# Patient Record
Sex: Male | Born: 1975 | Race: Black or African American | Hispanic: No | Marital: Single | Smoking: Current every day smoker
Health system: Southern US, Community
[De-identification: ages and names within clinical notes are randomized; demographics above are authoritative.]

## PROBLEM LIST (undated history)

## (undated) HISTORY — PX: NO PAST SURGERIES: SHX2092

---

## 2020-06-10 ENCOUNTER — Emergency Department (HOSPITAL_COMMUNITY): Payer: Self-pay

## 2020-06-10 ENCOUNTER — Encounter (HOSPITAL_COMMUNITY): Payer: Self-pay | Admitting: Emergency Medicine

## 2020-06-10 ENCOUNTER — Emergency Department (HOSPITAL_COMMUNITY)
Admission: EM | Admit: 2020-06-10 | Discharge: 2020-06-10 | Disposition: A | Discharge status: Home / Self Care | Payer: Self-pay | Attending: Emergency Medicine | Admitting: Emergency Medicine

## 2020-06-10 DIAGNOSIS — Y9389 Activity, other specified: Secondary | ICD-10-CM | POA: Insufficient documentation

## 2020-06-10 DIAGNOSIS — S40211A Abrasion of right shoulder, initial encounter: Secondary | ICD-10-CM | POA: Insufficient documentation

## 2020-06-10 DIAGNOSIS — Z20822 Contact with and (suspected) exposure to covid-19: Secondary | ICD-10-CM | POA: Insufficient documentation

## 2020-06-10 DIAGNOSIS — S0990XA Unspecified injury of head, initial encounter: Secondary | ICD-10-CM | POA: Insufficient documentation

## 2020-06-10 DIAGNOSIS — Y92410 Unspecified street and highway as the place of occurrence of the external cause: Secondary | ICD-10-CM | POA: Insufficient documentation

## 2020-06-10 DIAGNOSIS — Y998 Other external cause status: Secondary | ICD-10-CM | POA: Insufficient documentation

## 2020-06-10 DIAGNOSIS — Z23 Encounter for immunization: Secondary | ICD-10-CM | POA: Insufficient documentation

## 2020-06-10 DIAGNOSIS — F17200 Nicotine dependence, unspecified, uncomplicated: Secondary | ICD-10-CM | POA: Insufficient documentation

## 2020-06-10 DIAGNOSIS — T1490XA Injury, unspecified, initial encounter: Secondary | ICD-10-CM

## 2020-06-10 LAB — CBC
HCT: 42.2 % (ref 39.0–52.0)
Hemoglobin: 13.6 g/dL (ref 13.0–17.0)
MCH: 30.8 pg (ref 26.0–34.0)
MCHC: 32.2 g/dL (ref 30.0–36.0)
MCV: 95.7 fL (ref 80.0–100.0)
Platelets: 344 10*3/uL (ref 150–400)
RBC: 4.41 MIL/uL (ref 4.22–5.81)
RDW: 13.1 % (ref 11.5–15.5)
WBC: 7.9 10*3/uL (ref 4.0–10.5)
nRBC: 0 % (ref 0.0–0.2)

## 2020-06-10 LAB — I-STAT CHEM 8, ED
BUN: 4 mg/dL — ABNORMAL LOW (ref 6–20)
Calcium, Ion: 1.06 mmol/L — ABNORMAL LOW (ref 1.15–1.40)
Chloride: 108 mmol/L (ref 98–111)
Creatinine, Ser: 1.3 mg/dL — ABNORMAL HIGH (ref 0.61–1.24)
Glucose, Bld: 96 mg/dL (ref 70–99)
HCT: 41 % (ref 39.0–52.0)
Hemoglobin: 13.9 g/dL (ref 13.0–17.0)
Potassium: 3.7 mmol/L (ref 3.5–5.1)
Sodium: 143 mmol/L (ref 135–145)
TCO2: 20 mmol/L — ABNORMAL LOW (ref 22–32)

## 2020-06-10 LAB — COMPREHENSIVE METABOLIC PANEL
ALT: 25 U/L (ref 0–44)
AST: 51 U/L — ABNORMAL HIGH (ref 15–41)
Albumin: 3.7 g/dL (ref 3.5–5.0)
Alkaline Phosphatase: 49 U/L (ref 38–126)
Anion gap: 12 (ref 5–15)
BUN: 6 mg/dL (ref 6–20)
CO2: 20 mmol/L — ABNORMAL LOW (ref 22–32)
Calcium: 8.8 mg/dL — ABNORMAL LOW (ref 8.9–10.3)
Chloride: 110 mmol/L (ref 98–111)
Creatinine, Ser: 0.91 mg/dL (ref 0.61–1.24)
GFR calc Af Amer: >60 mL/min (ref >60)
GFR calc non Af Amer: >60 mL/min (ref >60)
Glucose, Bld: 92 mg/dL (ref 70–99)
Potassium: 3.9 mmol/L (ref 3.5–5.1)
Sodium: 142 mmol/L (ref 135–145)
Total Bilirubin: 0.6 mg/dL (ref 0.3–1.2)
Total Protein: 6.9 g/dL (ref 6.5–8.1)

## 2020-06-10 LAB — ETHANOL: Alcohol, Ethyl (B): 270 mg/dL — ABNORMAL HIGH (ref <10)

## 2020-06-10 LAB — SAMPLE TO BLOOD BANK

## 2020-06-10 LAB — PROTIME-INR
INR: 1 (ref 0.8–1.2)
Prothrombin Time: 13.2 seconds (ref 11.4–15.2)

## 2020-06-10 LAB — SARS CORONAVIRUS 2 (TAT 6-24 HRS): SARS Coronavirus 2: NEGATIVE

## 2020-06-10 LAB — LACTIC ACID, PLASMA: Lactic Acid, Venous: 1.5 mmol/L (ref 0.5–1.9)

## 2020-06-10 MED ORDER — BACITRACIN ZINC 500 UNIT/GM EX OINT: TOPICAL_OINTMENT | Freq: Two times a day (BID) | CUTANEOUS | Status: DC

## 2020-06-10 MED ORDER — TETANUS-DIPHTH-ACELL PERTUSSIS 5-2.5-18.5 LF-MCG/0.5 IM SUSP
0.5000 mL | Freq: Once | INTRAMUSCULAR | Status: AC
  Administered 2020-06-10: 0.5 mL via INTRAMUSCULAR

## 2020-06-10 MED ORDER — IOHEXOL 300 MG/ML  SOLN
100.0000 mL | Freq: Once | INTRAMUSCULAR | Status: AC | PRN
  Administered 2020-06-10: 100 mL via INTRAVENOUS

## 2020-06-10 MED ORDER — METHOCARBAMOL 500 MG PO TABS: 500.0000 mg | ORAL_TABLET | Freq: Two times a day (BID) | ORAL | 0 refills | Status: DC
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
## 2020-06-10 NOTE — ED Notes (Signed)
Patient transported to CT 
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
## 2020-06-21 ENCOUNTER — Other Ambulatory Visit: Payer: Self-pay

## 2020-06-21 ENCOUNTER — Encounter (HOSPITAL_COMMUNITY): Payer: Self-pay | Admitting: Emergency Medicine

## 2020-06-21 ENCOUNTER — Emergency Department (HOSPITAL_COMMUNITY): Payer: Self-pay

## 2020-06-21 ENCOUNTER — Emergency Department (HOSPITAL_COMMUNITY)
Admission: EM | Admit: 2020-06-21 | Discharge: 2020-06-22 | Disposition: A | Discharge status: Home / Self Care | Payer: Self-pay | Attending: Emergency Medicine | Admitting: Emergency Medicine

## 2020-06-21 DIAGNOSIS — F17200 Nicotine dependence, unspecified, uncomplicated: Secondary | ICD-10-CM | POA: Insufficient documentation

## 2020-06-21 DIAGNOSIS — T1490XA Injury, unspecified, initial encounter: Secondary | ICD-10-CM

## 2020-06-21 DIAGNOSIS — Y9269 Other specified industrial and construction area as the place of occurrence of the external cause: Secondary | ICD-10-CM | POA: Insufficient documentation

## 2020-06-21 DIAGNOSIS — S9032XA Contusion of left foot, initial encounter: Secondary | ICD-10-CM | POA: Insufficient documentation

## 2020-06-21 DIAGNOSIS — F1590 Other stimulant use, unspecified, uncomplicated: Secondary | ICD-10-CM | POA: Insufficient documentation

## 2020-06-21 DIAGNOSIS — W240XXA Contact with lifting devices, not elsewhere classified, initial encounter: Secondary | ICD-10-CM | POA: Insufficient documentation

## 2020-06-21 MED ORDER — HYDROCODONE-ACETAMINOPHEN 5-325 MG PO TABS
2.0000 | ORAL_TABLET | Freq: Once | ORAL | Status: AC
  Administered 2020-06-21: 2 via ORAL
  Filled 2020-06-21: qty 2
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
## 2021-02-05 ENCOUNTER — Observation Stay (HOSPITAL_COMMUNITY)
Admission: EM | Admit: 2021-02-05 | Discharge: 2021-02-07 | Disposition: A | Discharge status: Home / Self Care | Payer: Self-pay | Attending: Orthopedic Surgery | Admitting: Orthopedic Surgery

## 2021-02-05 ENCOUNTER — Encounter (HOSPITAL_COMMUNITY): Payer: Self-pay

## 2021-02-05 ENCOUNTER — Emergency Department (HOSPITAL_COMMUNITY): Payer: Self-pay

## 2021-02-05 DIAGNOSIS — S98112A Complete traumatic amputation of left great toe, initial encounter: Secondary | ICD-10-CM | POA: Diagnosis present

## 2021-02-05 DIAGNOSIS — S98139A Complete traumatic amputation of one unspecified lesser toe, initial encounter: Secondary | ICD-10-CM

## 2021-02-05 DIAGNOSIS — S98132A Complete traumatic amputation of one left lesser toe, initial encounter: Secondary | ICD-10-CM

## 2021-02-05 DIAGNOSIS — F17200 Nicotine dependence, unspecified, uncomplicated: Secondary | ICD-10-CM | POA: Insufficient documentation

## 2021-02-05 DIAGNOSIS — Z20822 Contact with and (suspected) exposure to covid-19: Secondary | ICD-10-CM | POA: Insufficient documentation

## 2021-02-05 DIAGNOSIS — W28XXXA Contact with powered lawn mower, initial encounter: Secondary | ICD-10-CM | POA: Insufficient documentation

## 2021-02-05 DIAGNOSIS — S98922A Partial traumatic amputation of left foot, level unspecified, initial encounter: Principal | ICD-10-CM | POA: Insufficient documentation

## 2021-02-05 DIAGNOSIS — Z23 Encounter for immunization: Secondary | ICD-10-CM | POA: Insufficient documentation

## 2021-02-05 LAB — CBC WITH DIFFERENTIAL/PLATELET
Abs Immature Granulocytes: 0.03 10*3/uL (ref 0.00–0.07)
Basophils Absolute: 0.1 10*3/uL (ref 0.0–0.1)
Basophils Relative: 1 %
Eosinophils Absolute: 0.1 10*3/uL (ref 0.0–0.5)
Eosinophils Relative: 1 %
HCT: 40.4 % (ref 39.0–52.0)
Hemoglobin: 13.8 g/dL (ref 13.0–17.0)
Immature Granulocytes: 0 %
Lymphocytes Relative: 25 %
Lymphs Abs: 2.1 10*3/uL (ref 0.7–4.0)
MCH: 31.6 pg (ref 26.0–34.0)
MCHC: 34.2 g/dL (ref 30.0–36.0)
MCV: 92.4 fL (ref 80.0–100.0)
Monocytes Absolute: 0.5 10*3/uL (ref 0.1–1.0)
Monocytes Relative: 6 %
Neutro Abs: 5.7 10*3/uL (ref 1.7–7.7)
Neutrophils Relative %: 67 %
Platelets: 353 10*3/uL (ref 150–400)
RBC: 4.37 MIL/uL (ref 4.22–5.81)
RDW: 12.9 % (ref 11.5–15.5)
WBC: 8.5 10*3/uL (ref 4.0–10.5)
nRBC: 0 % (ref 0.0–0.2)

## 2021-02-05 LAB — BASIC METABOLIC PANEL
Anion gap: 11 (ref 5–15)
BUN: 9 mg/dL (ref 6–20)
CO2: 24 mmol/L (ref 22–32)
Calcium: 8.9 mg/dL (ref 8.9–10.3)
Chloride: 102 mmol/L (ref 98–111)
Creatinine, Ser: 0.87 mg/dL (ref 0.61–1.24)
GFR, Estimated: >60 mL/min (ref >60)
Glucose, Bld: 108 mg/dL — ABNORMAL HIGH (ref 70–99)
Potassium: 3.6 mmol/L (ref 3.5–5.1)
Sodium: 137 mmol/L (ref 135–145)

## 2021-02-05 LAB — TYPE AND SCREEN
ABO/RH(D): A NEG
Antibody Screen: NEGATIVE

## 2021-02-05 LAB — RESP PANEL BY RT-PCR (FLU A&B, COVID) ARPGX2
Influenza A by PCR: NEGATIVE
Influenza B by PCR: NEGATIVE
SARS Coronavirus 2 by RT PCR: NEGATIVE

## 2021-02-05 MED ORDER — LORAZEPAM 2 MG/ML IJ SOLN
1.0000 mg | Freq: Once | INTRAMUSCULAR | Status: AC
  Administered 2021-02-05: 1 mg via INTRAVENOUS
  Filled 2021-02-05: qty 1

## 2021-02-05 MED ORDER — SODIUM CHLORIDE 0.9 % IV SOLN: Freq: Once | INTRAVENOUS | Status: AC

## 2021-02-05 MED ORDER — HYDROMORPHONE HCL 1 MG/ML IJ SOLN
1.0000 mg | Freq: Once | INTRAMUSCULAR | Status: AC
  Administered 2021-02-05: 1 mg via INTRAVENOUS
  Filled 2021-02-05: qty 1

## 2021-02-05 MED ORDER — ACETAMINOPHEN 325 MG PO TABS: 325.0000 mg | ORAL_TABLET | Freq: Four times a day (QID) | ORAL | Status: DC | PRN

## 2021-02-05 MED ORDER — BUPIVACAINE HCL (PF) 0.25 % IJ SOLN
30.0000 mL | Freq: Once | INTRAMUSCULAR | Status: AC
  Administered 2021-02-05: 30 mL
  Filled 2021-02-05: qty 30

## 2021-02-05 MED ORDER — HYDROMORPHONE HCL 1 MG/ML IJ SOLN
0.5000 mg | INTRAMUSCULAR | Status: DC | PRN
  Administered 2021-02-06: 1 mg via INTRAVENOUS
  Filled 2021-02-05 (×2): qty 1

## 2021-02-05 MED ORDER — TETANUS-DIPHTH-ACELL PERTUSSIS 5-2.5-18.5 LF-MCG/0.5 IM SUSY
0.5000 mL | PREFILLED_SYRINGE | Freq: Once | INTRAMUSCULAR | Status: AC
  Administered 2021-02-05: 0.5 mL via INTRAMUSCULAR
  Filled 2021-02-05: qty 0.5

## 2021-02-05 MED ORDER — CEFAZOLIN SODIUM-DEXTROSE 2-4 GM/100ML-% IV SOLN
2.0000 g | Freq: Once | INTRAVENOUS | Status: AC
  Administered 2021-02-05: 2 g via INTRAVENOUS
  Filled 2021-02-05: qty 100

## 2021-02-05 MED ORDER — ACETAMINOPHEN 500 MG PO TABS
1000.0000 mg | ORAL_TABLET | Freq: Four times a day (QID) | ORAL | Status: AC
  Administered 2021-02-06 (×2): 1000 mg via ORAL
  Filled 2021-02-05 (×3): qty 2

## 2021-02-05 MED ORDER — CEFAZOLIN SODIUM-DEXTROSE 2-4 GM/100ML-% IV SOLN
2.0000 g | Freq: Three times a day (TID) | INTRAVENOUS | Status: AC
  Administered 2021-02-06 (×2): 2 g via INTRAVENOUS
  Filled 2021-02-05 (×3): qty 100

## 2021-02-05 MED ORDER — NICOTINE 21 MG/24HR TD PT24
21.0000 mg | MEDICATED_PATCH | Freq: Once | TRANSDERMAL | Status: AC
  Administered 2021-02-05: 21 mg via TRANSDERMAL
  Filled 2021-02-05: qty 1

## 2021-02-05 MED ORDER — FENTANYL CITRATE (PF) 100 MCG/2ML IJ SOLN
50.0000 ug | INTRAMUSCULAR | Status: DC | PRN
  Administered 2021-02-05: 20:00:00 50 ug via INTRAVENOUS
  Filled 2021-02-05: qty 2

## 2021-02-05 MED ORDER — ONDANSETRON HCL 4 MG PO TABS: 4.0000 mg | ORAL_TABLET | Freq: Four times a day (QID) | ORAL | Status: DC | PRN

## 2021-02-05 MED ORDER — OXYCODONE HCL 5 MG PO TABS
10.0000 mg | ORAL_TABLET | ORAL | Status: DC | PRN
  Administered 2021-02-06: 10 mg via ORAL
  Administered 2021-02-07 (×2): 15 mg via ORAL
  Filled 2021-02-05 (×2): qty 3
  Filled 2021-02-05: qty 2

## 2021-02-05 MED ORDER — OXYCODONE HCL 5 MG PO TABS
5.0000 mg | ORAL_TABLET | ORAL | Status: DC | PRN
  Administered 2021-02-06: 10 mg via ORAL
  Filled 2021-02-05: qty 2

## 2021-02-05 MED ORDER — HYDROMORPHONE HCL 1 MG/ML IJ SOLN
1.0000 mg | INTRAMUSCULAR | Status: DC | PRN
  Administered 2021-02-05 – 2021-02-07 (×5): 1 mg via INTRAVENOUS
  Filled 2021-02-05 (×4): qty 1

## 2021-02-05 MED ORDER — ONDANSETRON HCL 4 MG/2ML IJ SOLN: 4.0000 mg | Freq: Four times a day (QID) | INTRAMUSCULAR | Status: DC | PRN

## 2021-02-05 MED ORDER — METRONIDAZOLE 500 MG/100ML IV SOLN
500.0000 mg | Freq: Three times a day (TID) | INTRAVENOUS | Status: AC
  Administered 2021-02-06 (×2): 500 mg via INTRAVENOUS
  Filled 2021-02-05 (×2): qty 100

## 2021-02-05 MED ORDER — ONDANSETRON HCL 4 MG/2ML IJ SOLN
4.0000 mg | Freq: Once | INTRAMUSCULAR | Status: AC
  Administered 2021-02-05: 4 mg via INTRAVENOUS
  Filled 2021-02-05: qty 2

## 2021-02-05 NOTE — ED Notes (Signed)
Patient transported to X-ray 

## 2021-02-05 NOTE — ED Provider Notes (Addendum)
[facility] EMERGENCY DEPARTMENT Provider Note   CSN: [id number] Arrival date & time: 02/05/21  1957     History Chief Complaint  Patient presents with  . Foot Injury    Jimmy Allen is a 45 y.o. male.  HPI Patient was mowing the lawn with a push mower.  He slipped and fell with his left foot going underneath the mower.  He sustained significant injury to the forefoot.  He also got a minor abrasion to his left forearm.  No other associated injury.  He reports there was bleeding and a lot of pain.    History reviewed. No pertinent past medical history.  There are no problems to display for this patient.   History reviewed. No pertinent surgical history.     History reviewed. No pertinent family history.  Social History   Tobacco Use  . Smoking status: Current Every Day Smoker  . Smokeless tobacco: Never Used  Substance Use Topics  . Alcohol use: Yes  . Drug use: Yes    Types: Marijuana    Home Medications Prior to Admission medications   Medication Sig Start Date End Date Taking? Authorizing Provider  methocarbamol (ROBAXIN) 500 MG tablet Take 1 tablet (500 mg total) by mouth 2 (two) times daily. 06/10/20   Dietrich Pates, PA-C    Allergies    Patient has no known allergies.  Review of Systems   Review of Systems Contusional: No recent fever chills malaise Or logic: No head injury no headache no confusion Physical Exam Updated Vital Signs BP 132/76 (BP Location: Right Arm)   Pulse 88   Temp 98.4 F (36.9 C) (Oral)   Resp 17   Ht 5\' 9"  (1.753 m)   Wt 74 kg   SpO2 100%   BMI 24.09 kg/m   Physical Exam Constitutional:      Comments: Alert with clear mental status.  GCS 15.  No respiratory distress.  HENT:     Mouth/Throat:     Pharynx: Oropharynx is clear.  Eyes:     Extraocular Movements: Extraocular movements intact.  Cardiovascular:     Rate and Rhythm: Normal rate and regular rhythm.  Pulmonary:     Effort:  Pulmonary effort is normal.     Breath sounds: Normal breath sounds.  Abdominal:     General: There is no distension.     Palpations: Abdomen is soft.  Musculoskeletal:     Comments: Macerated avulsion injury left foot.  Great toe macerated and avulsed, maceration to the second toe and laceration to the forefoot.  Attached images.  Deep, linear abrasion to the forearm approximately 6 cm volar left  Skin:    General: Skin is warm and dry.  Neurological:     General: No focal deficit present.     Mental Status: He is oriented to person, place, and time.     Coordination: Coordination normal.             ED Results / Procedures / Treatments   Labs (all labs ordered are listed, but only abnormal results are displayed) Labs Reviewed  RESP PANEL BY RT-PCR (FLU A&B, COVID) [id number]    EKG None  Radiology No results found.  Procedures . Laceration Repair  Date/Time: 02/06/2021 12:05 AM Performed by: 04/08/2021, MD Authorized by: Arby Barrette, MD   Consent:    Consent obtained:  Verbal   Consent given by:  Patient Repair type:    Repair type:  Complex Comments:  I first attempted cleaning wound with IV medications Dilaudid for pain control.  Patient still had severe pain with irrigation of the wound with sterile water.  I subsequently used 15 mL Marcaine to try to achieve a digital block around the forefoot of the first 2 digits.  Patient also had been given a milligram of Ativan and 1 mg Dilaudid.  Still, the process of irrigating the exposed bone and surrounding tissues was exquisitely painful.  Was able to remove majority of visible foreign bodies and irrigate lightly but this was very painful despite significant IV medications and attempted local anesthesia.  Wound was then dressed with a large piece of Xeroform, ABD pad and Kerlix.    CRITICAL CARE Performed by: Arby Barrette   Total critical care time: 30 minutes  Critical care time was exclusive of  separately billable procedures and treating other patients.  Critical care was necessary to treat or prevent imminent or life-threatening deterioration.  Critical care was time spent personally by me on the following activities: development of treatment plan with patient and/or surrogate as well as nursing, discussions with consultants, evaluation of patient's response to treatment, examination of patient, obtaining history from patient or surrogate, ordering and performing treatments and interventions, ordering and review of laboratory studies, ordering and review of radiographic studies, pulse oximetry and re-evaluation of patient's condition. Medications Ordered in ED Medications  fentaNYL (SUBLIMAZE) injection 50 mcg (50 mcg Intravenous Given 02/05/21 2013)  ceFAZolin (ANCEF) IVPB 2g/100 mL premix (has no administration in time range)  HYDROmorphone (DILAUDID) injection 1 mg (has no administration in time range)  0.9 %  sodium chloride infusion (has no administration in time range)  Tdap (BOOSTRIX) injection 0.5 mL (has no administration in time range)  ondansetron (ZOFRAN) injection 4 mg (4 mg Intravenous Given 02/05/21 2014)    ED Course  I have reviewed the triage vital signs and the nursing notes.  Pertinent labs & imaging results that were available during my care of the patient were reviewed by me and considered in my medical decision making (see chart for details).    MDM Rules/Calculators/A&P                          Patient last ate 11:45 AM. No known drug allergies. No Medical history.  Consult: Dr. Aundria Rud orthopedics.  Patient with macerated and amputated great toe and second toe on the left foot due to lawnmower injury.  Staff ordered, tetanus update ordered, orthopedics consulted.  Dilaudid and fluids ordered.  Final Clinical Impression(s) / ED Diagnoses Final diagnoses:  Amputation, traumatic, toes (HCC)  Amputation, toe, traumatic (HCC)    Rx / DC Orders ED  Discharge Orders    None       Arby Barrette, MD 02/05/21 2124    Arby Barrette, MD 02/06/21 0008

## 2021-02-05 NOTE — ED Triage Notes (Signed)
Pt BIB [facility] for eval of L foot great and second toe amputation via lawn mower just PTA. Was cut through sock

## 2021-02-05 NOTE — Progress Notes (Signed)
I have discussed patient with the EDP and reviewed images and xrays.  Will need formal I and D with formalization of his traumatic amputations of the left foot toes in the OR tomorrow.  abx given, and will continue until post op.  Dr. Lajoyce Corners has offered to take over care as his schedule has availability sooner tomorrow.  Full H and P to come in the am.

## 2021-02-06 ENCOUNTER — Observation Stay (HOSPITAL_COMMUNITY): Payer: Self-pay | Admitting: Anesthesiology

## 2021-02-06 ENCOUNTER — Other Ambulatory Visit: Payer: Self-pay | Admitting: Physician Assistant

## 2021-02-06 ENCOUNTER — Encounter (HOSPITAL_COMMUNITY)
Admission: EM | Disposition: A | Discharge status: Home / Self Care | Payer: Self-pay | Source: Home / Self Care | Attending: Emergency Medicine

## 2021-02-06 ENCOUNTER — Encounter (HOSPITAL_COMMUNITY): Payer: Self-pay | Admitting: Orthopedic Surgery

## 2021-02-06 DIAGNOSIS — S98132A Complete traumatic amputation of one left lesser toe, initial encounter: Secondary | ICD-10-CM

## 2021-02-06 HISTORY — PX: AMPUTATION: SHX166

## 2021-02-06 LAB — SURGICAL PCR SCREEN
MRSA, PCR: NEGATIVE
Staphylococcus aureus: POSITIVE — AB

## 2021-02-06 LAB — HIV ANTIBODY (ROUTINE TESTING W REFLEX): HIV Screen 4th Generation wRfx: NONREACTIVE

## 2021-02-06 LAB — ABO/RH: ABO/RH(D): A NEG

## 2021-02-06 SURGERY — AMPUTATION, FOOT, PARTIAL
Anesthesia: General | Site: Foot | Laterality: Left

## 2021-02-06 MED ORDER — CEFAZOLIN SODIUM-DEXTROSE 2-4 GM/100ML-% IV SOLN
2.0000 g | INTRAVENOUS | Status: DC
  Filled 2021-02-06 (×2): qty 100

## 2021-02-06 MED ORDER — CHLORHEXIDINE GLUCONATE 0.12 % MT SOLN: 15.0000 mL | Freq: Once | OROMUCOSAL | Status: AC

## 2021-02-06 MED ORDER — MUPIROCIN 2 % EX OINT
1.0000 "application " | TOPICAL_OINTMENT | Freq: Two times a day (BID) | CUTANEOUS | Status: DC
  Administered 2021-02-06 (×2): 1 via NASAL
  Filled 2021-02-06 (×2): qty 22

## 2021-02-06 MED ORDER — ONDANSETRON HCL 4 MG/2ML IJ SOLN
INTRAMUSCULAR | Status: AC
  Filled 2021-02-06: qty 2

## 2021-02-06 MED ORDER — FENTANYL CITRATE (PF) 100 MCG/2ML IJ SOLN
INTRAMUSCULAR | Status: AC
  Filled 2021-02-06: qty 2

## 2021-02-06 MED ORDER — PROMETHAZINE HCL 25 MG/ML IJ SOLN: 6.2500 mg | INTRAMUSCULAR | Status: DC | PRN

## 2021-02-06 MED ORDER — SODIUM CHLORIDE 0.9 % IV SOLN: INTRAVENOUS | Status: DC

## 2021-02-06 MED ORDER — OXYCODONE HCL 5 MG/5ML PO SOLN: 5.0000 mg | Freq: Once | ORAL | Status: AC | PRN

## 2021-02-06 MED ORDER — FENTANYL CITRATE (PF) 100 MCG/2ML IJ SOLN
INTRAMUSCULAR | Status: DC | PRN
  Administered 2021-02-06 (×2): 50 ug via INTRAVENOUS

## 2021-02-06 MED ORDER — ASCORBIC ACID 500 MG PO TABS
1000.0000 mg | ORAL_TABLET | Freq: Every day | ORAL | Status: DC
  Administered 2021-02-06: 1000 mg via ORAL
  Filled 2021-02-06 (×3): qty 2

## 2021-02-06 MED ORDER — PROPOFOL 500 MG/50ML IV EMUL
INTRAVENOUS | Status: DC | PRN
  Administered 2021-02-06: 50 ug/kg/min via INTRAVENOUS

## 2021-02-06 MED ORDER — MIDAZOLAM HCL 2 MG/2ML IJ SOLN
INTRAMUSCULAR | Status: AC
  Filled 2021-02-06: qty 2

## 2021-02-06 MED ORDER — OXYCODONE HCL 5 MG PO TABS
ORAL_TABLET | ORAL | Status: AC
  Filled 2021-02-06: qty 1

## 2021-02-06 MED ORDER — LIDOCAINE HCL 1 % IJ SOLN
INTRAMUSCULAR | Status: DC | PRN
  Administered 2021-02-06: 30 mL

## 2021-02-06 MED ORDER — POVIDONE-IODINE 10 % EX SWAB: 2.0000 "application " | Freq: Once | CUTANEOUS | Status: DC

## 2021-02-06 MED ORDER — CHLORHEXIDINE GLUCONATE 4 % EX LIQD
60.0000 mL | Freq: Once | CUTANEOUS | Status: AC
  Administered 2021-02-06: 4 via TOPICAL

## 2021-02-06 MED ORDER — 0.9 % SODIUM CHLORIDE (POUR BTL) OPTIME
TOPICAL | Status: DC | PRN
  Administered 2021-02-06: 2000 mL

## 2021-02-06 MED ORDER — KETAMINE HCL 50 MG/5ML IJ SOSY
PREFILLED_SYRINGE | INTRAMUSCULAR | Status: AC
  Filled 2021-02-06: qty 5

## 2021-02-06 MED ORDER — NICOTINE 21 MG/24HR TD PT24: 21.0000 mg | MEDICATED_PATCH | Freq: Once | TRANSDERMAL | 0 refills | Status: AC

## 2021-02-06 MED ORDER — CHLORHEXIDINE GLUCONATE 4 % EX LIQD: 60.0000 mL | Freq: Once | CUTANEOUS | Status: DC

## 2021-02-06 MED ORDER — OXYCODONE HCL 5 MG PO TABS: 5.0000 mg | ORAL_TABLET | Freq: Once | ORAL | 0 refills | Status: DC | PRN

## 2021-02-06 MED ORDER — LACTATED RINGERS IV SOLN: INTRAVENOUS | Status: DC

## 2021-02-06 MED ORDER — ACETAMINOPHEN 10 MG/ML IV SOLN: 1000.0000 mg | Freq: Once | INTRAVENOUS | Status: DC | PRN

## 2021-02-06 MED ORDER — ONDANSETRON HCL 4 MG/2ML IJ SOLN
INTRAMUSCULAR | Status: DC | PRN
  Administered 2021-02-06: 4 mg via INTRAVENOUS

## 2021-02-06 MED ORDER — FENTANYL CITRATE (PF) 100 MCG/2ML IJ SOLN: 25.0000 ug | INTRAMUSCULAR | Status: DC | PRN

## 2021-02-06 MED ORDER — GUAIFENESIN-DM 100-10 MG/5ML PO SYRP: 15.0000 mL | ORAL_SOLUTION | ORAL | Status: DC | PRN

## 2021-02-06 MED ORDER — OXYCODONE HCL 5 MG PO TABS
5.0000 mg | ORAL_TABLET | Freq: Once | ORAL | Status: AC | PRN
  Administered 2021-02-06: 5 mg via ORAL

## 2021-02-06 MED ORDER — MIDAZOLAM HCL 5 MG/5ML IJ SOLN
INTRAMUSCULAR | Status: DC | PRN
  Administered 2021-02-06: 2 mg via INTRAVENOUS

## 2021-02-06 MED ORDER — ZINC SULFATE 220 (50 ZN) MG PO CAPS
220.0000 mg | ORAL_CAPSULE | Freq: Every day | ORAL | Status: DC
  Administered 2021-02-06: 220 mg via ORAL
  Filled 2021-02-06 (×2): qty 1

## 2021-02-06 MED ORDER — PROPOFOL 10 MG/ML IV BOLUS
INTRAVENOUS | Status: DC | PRN
  Administered 2021-02-06 (×2): 20 mg via INTRAVENOUS

## 2021-02-06 MED ORDER — KETOROLAC TROMETHAMINE 30 MG/ML IJ SOLN: 30.0000 mg | Freq: Once | INTRAMUSCULAR | Status: DC

## 2021-02-06 MED ORDER — CEFAZOLIN SODIUM-DEXTROSE 2-4 GM/100ML-% IV SOLN: 2.0000 g | INTRAVENOUS | Status: DC

## 2021-02-06 MED ORDER — PHENOL 1.4 % MT LIQD: 1.0000 | OROMUCOSAL | Status: DC | PRN

## 2021-02-06 MED ORDER — CHLORHEXIDINE GLUCONATE 0.12 % MT SOLN
OROMUCOSAL | Status: AC
  Administered 2021-02-06: 15 mL via OROMUCOSAL
  Filled 2021-02-06: qty 15

## 2021-02-06 MED ORDER — AMISULPRIDE (ANTIEMETIC) 5 MG/2ML IV SOLN: 10.0000 mg | Freq: Once | INTRAVENOUS | Status: DC | PRN

## 2021-02-06 MED ORDER — PROPOFOL 10 MG/ML IV BOLUS
INTRAVENOUS | Status: AC
  Filled 2021-02-06: qty 20

## 2021-02-06 MED ORDER — KETAMINE HCL 10 MG/ML IJ SOLN
INTRAMUSCULAR | Status: DC | PRN
  Administered 2021-02-06: 10 mg via INTRAVENOUS
  Administered 2021-02-06 (×3): 5 mg via INTRAVENOUS

## 2021-02-06 MED ORDER — ORAL CARE MOUTH RINSE: 15.0000 mL | Freq: Once | OROMUCOSAL | Status: AC

## 2021-02-06 MED ORDER — LIDOCAINE HCL (PF) 1 % IJ SOLN
INTRAMUSCULAR | Status: AC
  Filled 2021-02-06: qty 30

## 2021-02-06 SURGICAL SUPPLY — 34 items
BENZOIN TINCTURE PRP APPL 2/3 (GAUZE/BANDAGES/DRESSINGS) ×2 IMPLANT
BLADE SAW SGTL HD 18.5X60.5X1. (BLADE) ×2 IMPLANT
BLADE SURG 21 STRL SS (BLADE) ×2 IMPLANT
BNDG COHESIVE 4X5 TAN STRL (GAUZE/BANDAGES/DRESSINGS) ×2 IMPLANT
BNDG GAUZE ELAST 4 BULKY (GAUZE/BANDAGES/DRESSINGS) ×2 IMPLANT
COVER SURGICAL LIGHT HANDLE (MISCELLANEOUS) ×2 IMPLANT
COVER WAND RF STERILE (DRAPES) ×2 IMPLANT
DRAPE INCISE IOBAN 66X45 STRL (DRAPES) ×2 IMPLANT
DRAPE U-SHAPE 47X51 STRL (DRAPES) ×2 IMPLANT
DRSG ADAPTIC 3X8 NADH LF (GAUZE/BANDAGES/DRESSINGS) ×2 IMPLANT
DRSG PAD ABDOMINAL 8X10 ST (GAUZE/BANDAGES/DRESSINGS) IMPLANT
DURAPREP 26ML APPLICATOR (WOUND CARE) ×2 IMPLANT
ELECT REM PT RETURN 9FT ADLT (ELECTROSURGICAL) ×2
ELECTRODE REM PT RTRN 9FT ADLT (ELECTROSURGICAL) ×1 IMPLANT
GAUZE SPONGE 4X4 12PLY STRL (GAUZE/BANDAGES/DRESSINGS) ×2 IMPLANT
GLOVE BIOGEL PI IND STRL 9 (GLOVE) ×1 IMPLANT
GLOVE BIOGEL PI INDICATOR 9 (GLOVE) ×1
GLOVE SURG ORTHO 9.0 STRL STRW (GLOVE) ×2 IMPLANT
GOWN STRL REUS W/ TWL XL LVL3 (GOWN DISPOSABLE) ×3 IMPLANT
GOWN STRL REUS W/TWL XL LVL3 (GOWN DISPOSABLE) ×3
KIT BASIN OR (CUSTOM PROCEDURE TRAY) ×2 IMPLANT
KIT TURNOVER KIT B (KITS) ×2 IMPLANT
NEEDLE HYPO 25GX1X1/2 BEV (NEEDLE) ×2 IMPLANT
NS IRRIG 1000ML POUR BTL (IV SOLUTION) ×2 IMPLANT
PACK ORTHO EXTREMITY (CUSTOM PROCEDURE TRAY) ×2 IMPLANT
PAD ARMBOARD 7.5X6 YLW CONV (MISCELLANEOUS) ×4 IMPLANT
SPONGE LAP 18X18 RF (DISPOSABLE) IMPLANT
SUT ETHILON 2 0 PSLX (SUTURE) ×4 IMPLANT
SYR CONTROL 10ML LL (SYRINGE) ×2 IMPLANT
TOWEL GREEN STERILE (TOWEL DISPOSABLE) ×2 IMPLANT
TOWEL GREEN STERILE FF (TOWEL DISPOSABLE) ×2 IMPLANT
TUBE CONNECTING 12X1/4 (SUCTIONS) ×2 IMPLANT
WATER STERILE IRR 1000ML POUR (IV SOLUTION) ×2 IMPLANT
YANKAUER SUCT BULB TIP NO VENT (SUCTIONS) ×2 IMPLANT

## 2021-02-06 NOTE — Progress Notes (Signed)
Orthopedic Tech Progress Note Patient Details:  Jimmy Allen [birth year] [id number]  Ortho Devices Type of Ortho Device: Postop shoe/boot Ortho Device/Splint Location: LLE Ortho Device/Splint Interventions: Ordered   Post Interventions Patient Tolerated: Other (comment) Instructions Provided: Other (comment)   Jimmy Allen 02/06/2021, 5:39 PM

## 2021-02-06 NOTE — Consult Note (Signed)
ORTHOPAEDIC CONSULTATION  REQUESTING PHYSICIAN: Nadara Mustard, MD  Chief Complaint: Lawnmower injury left foot.  HPI: Jimmy Allen is a 45 y.o. male who presents with amputation of the left foot great toe and second toe secondary to a lawnmower injury.  Patient states that he was cleaning up the yard stumbled fell and sustained a lawnmower injury to the left forefoot.  History reviewed. No pertinent past medical history. History reviewed. No pertinent surgical history. Social History   Socioeconomic History  . Marital status: Single    Spouse name: Not on file  . Number of children: Not on file  . Years of education: Not on file  . Highest education level: Not on file  Occupational History  . Not on file  Tobacco Use  . Smoking status: Current Every Day Smoker  . Smokeless tobacco: Never Used  Substance and Sexual Activity  . Alcohol use: Yes  . Drug use: Yes    Types: Marijuana  . Sexual activity: Not on file  Other Topics Concern  . Not on file  Social History Narrative  . Not on file   Social Determinants of Health   Financial Resource Strain: Not on file  Food Insecurity: Not on file  Transportation Needs: Not on file  Physical Activity: Not on file  Stress: Not on file  Social Connections: Not on file   History reviewed. No pertinent family history. - negative except otherwise stated in the family history section No Known Allergies Prior to Admission medications   Medication Sig Start Date End Date Taking? Authorizing Provider  methocarbamol (ROBAXIN) 500 MG tablet Take 1 tablet (500 mg total) by mouth 2 (two) times daily. 06/10/20   Dietrich Pates, PA-C   DG Foot 2 Views Left  Result Date: 02/05/2021 CLINICAL DATA:  Toe amputation by lawnmower EXAM: LEFT FOOT - 2 VIEW COMPARISON:  06/21/2020 FINDINGS: Bony and soft tissue amputation of the first and second digits at the level of IP joints. Residual base of second middle phalanx. Comminuted  displaced fracture involving the head of the first proximal phalanx. Additional cortical fracture involving the head of the first metatarsal. IMPRESSION: 1. Bony and soft tissue amputation of first and second digit as above. Comminuted displaced fracture involving head of first proximal phalanx with additional cortical fracture involving head of first metatarsal. Electronically Signed   By: Jasmine Pang M.D.   On: 02/05/2021 21:34   - pertinent xrays, CT, MRI studies were reviewed and independently interpreted  Positive ROS: All other systems have been reviewed and were otherwise negative with the exception of those mentioned in the HPI and as above.  Physical Exam: General: Alert, no acute distress Psychiatric: Patient is competent for consent with normal mood and affect Lymphatic: No axillary or cervical lymphadenopathy Cardiovascular: No pedal edema Respiratory: No cyanosis, no use of accessory musculature GI: No organomegaly, abdomen is soft and non-tender    Images:  @ENCIMAGES @  Labs:  No results found for: HGBA1C, ESRSEDRATE, CRP, LABURIC, REPTSTATUS, GRAMSTAIN, CULT, LABORGA  Lab Results  Component Value Date   ALBUMIN 3.7 06/10/2020     CBC EXTENDED Latest Ref Rng & Units 02/05/2021 06/10/2020 06/10/2020  WBC 4.0 - 10.5 K/uL 8.5 - 7.9  RBC 4.22 - 5.81 MIL/uL 4.37 - 4.41  HGB 13.0 - 17.0 g/dL 08/10/2020 24.2 68.3  HCT 41.9 - 52.0 % 40.4 41.0 42.2  PLT 150 - 400 K/uL 353 - 344  NEUTROABS 1.7 - 7.7 K/uL 5.7 - -  LYMPHSABS 0.7 - 4.0 K/uL 2.1 - -    Neurologic: Patient does not have protective sensation bilateral lower extremities.   MUSCULOSKELETAL:   Skin: Examination patient has grass and a traumatic lawnmower injury to the great toe and second toe with complete destruction of both toes.  The soft tissue injury extends up to the MTP joint of both the great toe and second toe.  Patient has a good pulse.  He is a smoker.  Patient has destructive bony changes of the distal  phalanx of the great toe and second toe by radiographs. Assessment: Assessment: Traumatic amputation of the great toe and second toe with contamination secondary to a lawnmower injury.  Plan: Plan: We will plan for amputation of the great toe and second toe through the MTP joint.  Risks and benefits were discussed including risk of the wound not healing potential for additional surgery.  Patient states he understands wished to proceed at this time the importance of smoking cessation for wound healing was also discussed.  Thank you for the consult and the opportunity to see Jimmy Allen  Aldean Baker, MD [facility] [phone] 7:08 AM

## 2021-02-06 NOTE — Transfer of Care (Signed)
Immediate Anesthesia Transfer of Care Note  Patient: Jimmy Allen  Procedure(s) Performed: Revision AMPUTATION FOOT left and multiple toes (Left Foot)  Patient Location: PACU  Anesthesia Type:MAC  Level of Consciousness: drowsy  Airway & Oxygen Therapy: Patient Spontanous Breathing  Post-op Assessment: Report given to RN and Post -op Vital signs reviewed and stable  Post vital signs: Reviewed and stable  Last Vitals:  Vitals Value Taken Time  BP 108/78 02/06/21 1316  Temp 36.5 C 02/06/21 1315  Pulse 68 02/06/21 1322  Resp 9 02/06/21 1322  SpO2 96 % 02/06/21 1322  Vitals shown include unvalidated device data.  Last Pain:  Vitals:   02/06/21 1147  TempSrc:   PainSc: 5       Patients Stated Pain Goal: 3 (70/34/03 5248)  Complications: No complications documented.

## 2021-02-06 NOTE — Progress Notes (Signed)
Pt needs cardiac monitoring but there are no tele boxes available on unit. Called [facility] and they do not have any extra at this time. Will continue to try and find a tele box.

## 2021-02-06 NOTE — TOC CAGE-AID Note (Signed)
Transition of Care [facility]) - CAGE-AID Screening   Patient Details  Name: Jimmy Allen MRN: [id number] Date of Birth: 1976-08-12  [facility] of [facility]) CM/SW Contact:    Erin Sons, LCSW Phone Number: 02/06/2021, 3:14 PM   Clinical Narrative:  CSW completed CAGE-AID with pt; score of 1. Pt states he drinks 2 beers daily. He states he has already cut down quite a bit explaining that he used to be ex [employer] and used to drink more. CSW provides pt with tx and education resources. Pt has no questions or concerns at this time.   CAGE-AID Screening:    Have You Ever Felt You Ought to Cut Down on Your Drinking or Drug Use?: Yes Have People Annoyed You By Critizing Your Drinking Or Drug Use?: No Have You Felt Bad Or Guilty About Your Drinking Or Drug Use?: No Have You Ever Had a Drink or Used Drugs First Thing In The Morning to Steady Your Nerves or to Get Rid of a Hangover?: No CAGE-AID Score: 1  Substance Abuse Education Offered: Yes

## 2021-02-06 NOTE — Progress Notes (Signed)
Pt arrived to ED via stretcher. He was able to transfer from stretcher to the bed by himself. 2 RN skin assessment completed with Charge RN Alona Bene. Pt's amputated digits are at the bedside. Will continue to monitor.

## 2021-02-06 NOTE — H&P (Signed)
Jimmy Allen is an 45 y.o. male.   Chief Complaint: Lawnmower injury left foot HPI: Patient is a 45 year old gentleman who presents with traumatic amputation of the left foot secondary to a lawnmower injury.  Patient states she was cleaning up the yard stumble and fell. Miller injury to the left forefoot with mangled great toe and second toe.  History reviewed. No pertinent past medical history.  History reviewed. No pertinent surgical history.  History reviewed. No pertinent family history. Social History:  reports that he has been smoking. He has never used smokeless tobacco. He reports current alcohol use. He reports current drug use. Drug: Marijuana.  Allergies: No Known Allergies  Medications Prior to Admission  Medication Sig Dispense Refill  . methocarbamol (ROBAXIN) 500 MG tablet Take 1 tablet (500 mg total) by mouth 2 (two) times daily. 20 tablet 0    Results for orders placed or performed during the hospital encounter of 02/05/21 (from the past 48 hour(s))  Resp Panel by RT-PCR (Flu A&B, Covid) Nasopharyngeal Swab     Status: None   Collection Time: 02/05/21  8:59 PM   Specimen: Nasopharyngeal Swab; Nasopharyngeal(NP) swabs in vial transport medium  Result Value Ref Range   SARS Coronavirus 2 by RT PCR NEGATIVE NEGATIVE    Comment: (NOTE) SARS-CoV-2 target nucleic acids are NOT DETECTED.  The SARS-CoV-2 RNA is generally detectable in upper respiratory specimens during the acute phase of infection. The lowest concentration of SARS-CoV-2 viral copies this assay can detect is 138 copies/mL. A negative result does not preclude SARS-Cov-2 infection and should not be used as the sole basis for treatment or other patient management decisions. A negative result may occur with  improper specimen collection/handling, submission of specimen other than nasopharyngeal swab, presence of viral mutation(s) within the areas targeted by this assay, and inadequate number of  viral copies(<138 copies/mL). A negative result must be combined with clinical observations, patient history, and epidemiological information. The expected result is Negative.  Fact Sheet for Patients:  BloggerCourse.com  Fact Sheet for Healthcare Providers:  SeriousBroker.it  This test is no t yet approved or cleared by the [place] FDA and  has been authorized for detection and/or diagnosis of SARS-CoV-2 by FDA under an Emergency Use Authorization (EUA). This EUA will remain  in effect (meaning this test can be used) for the duration of the COVID-19 declaration under Section 564(b)(1) of the Act, 21 U.S.C.section 360bbb-3(b)(1), unless the authorization is terminated  or revoked sooner.       Influenza A by PCR NEGATIVE NEGATIVE   Influenza B by PCR NEGATIVE NEGATIVE    Comment: (NOTE) The Xpert Xpress SARS-CoV-2/FLU/RSV plus assay is intended as an aid in the diagnosis of influenza from Nasopharyngeal swab specimens and should not be used as a sole basis for treatment. Nasal washings and aspirates are unacceptable for Xpert Xpress SARS-CoV-2/FLU/RSV testing.  Fact Sheet for Patients: BloggerCourse.com  Fact Sheet for Healthcare Providers: SeriousBroker.it  This test is not yet approved or cleared by the [place] FDA and has been authorized for detection and/or diagnosis of SARS-CoV-2 by FDA under an Emergency Use Authorization (EUA). This EUA will remain in effect (meaning this test can be used) for the duration of the COVID-19 declaration under Section 564(b)(1) of the Act, 21 U.S.C. section 360bbb-3(b)(1), unless the authorization is terminated or revoked.  Performed at [facility], [address] [place], [place] [address]   Basic metabolic panel     Status: Abnormal  Collection Time: 02/05/21 10:20 PM  Result Value Ref Range   Sodium 137 135 -  145 mmol/L   Potassium 3.6 3.5 - 5.1 mmol/L   Chloride 102 98 - 111 mmol/L   CO2 24 22 - 32 mmol/L   Glucose, Bld 108 (H) 70 - 99 mg/dL    Comment: Glucose reference range applies only to samples taken after fasting for at least 8 hours.   BUN 9 6 - 20 mg/dL   Creatinine, Ser 2.83 0.61 - 1.24 mg/dL   Calcium 8.9 8.9 - 15.1 mg/dL   GFR, Estimated >76 >16 mL/min    Comment: (NOTE) Calculated using the CKD-EPI Creatinine Equation (2021)    Anion gap 11 5 - 15    Comment: Performed at [facility], [address] [place], [place] [address]  CBC with Differential     Status: None   Collection Time: 02/05/21 10:20 PM  Result Value Ref Range   WBC 8.5 4.0 - 10.5 K/uL   RBC 4.37 4.22 - 5.81 MIL/uL   Hemoglobin 13.8 13.0 - 17.0 g/dL   HCT 06.2 69.4 - 85.4 %   MCV 92.4 80.0 - 100.0 fL   MCH 31.6 26.0 - 34.0 pg   MCHC 34.2 30.0 - 36.0 g/dL   RDW 62.7 03.5 - 00.9 %   Platelets 353 150 - 400 K/uL   nRBC 0.0 0.0 - 0.2 %   Neutrophils Relative % 67 %   Neutro Abs 5.7 1.7 - 7.7 K/uL   Lymphocytes Relative 25 %   Lymphs Abs 2.1 0.7 - 4.0 K/uL   Monocytes Relative 6 %   Monocytes Absolute 0.5 0.1 - 1.0 K/uL   Eosinophils Relative 1 %   Eosinophils Absolute 0.1 0.0 - 0.5 K/uL   Basophils Relative 1 %   Basophils Absolute 0.1 0.0 - 0.1 K/uL   Immature Granulocytes 0 %   Abs Immature Granulocytes 0.03 0.00 - 0.07 K/uL    Comment: Performed at [facility], [address] [place], [place] [address]  Type and screen [facility]     Status: None   Collection Time: 02/05/21 10:23 PM  Result Value Ref Range   ABO/RH(D) A NEG    Antibody Screen NEG    Sample Expiration      02/08/2021,2359 Performed at [facility], [address], [place], [place] [address]   ABO/Rh     Status: None   Collection Time: 02/06/21 12:25 AM  Result Value Ref Range   ABO/RH(D)      A NEG Performed at [facility], [address] [place], [place] [address]   HIV  Antibody (routine testing w rflx)     Status: None   Collection Time: 02/06/21 12:25 AM  Result Value Ref Range   HIV Screen 4th Generation wRfx Non Reactive Non Reactive    Comment: Performed at [facility], [address] [place], [place] [address]  Surgical pcr screen     Status: Abnormal   Collection Time: 02/06/21  1:23 AM   Specimen: Nasal Mucosa; Nasal Swab  Result Value Ref Range   MRSA, PCR NEGATIVE NEGATIVE   Staphylococcus aureus POSITIVE (A) NEGATIVE    Comment: (NOTE) The Xpert SA Assay (FDA approved for NASAL specimens in patients 29 years of age and older), is one component of a comprehensive surveillance program. It is not intended to diagnose infection nor to guide or monitor treatment. Performed  at [facility], [address] [place], [place] [address]    DG Foot 2 Views Left  Result Date: 02/05/2021 CLINICAL DATA:  Toe amputation by lawnmower EXAM: LEFT FOOT - 2 VIEW COMPARISON:  06/21/2020 FINDINGS: Bony and soft tissue amputation of the first and second digits at the level of IP joints. Residual base of second middle phalanx. Comminuted displaced fracture involving the head of the first proximal phalanx. Additional cortical fracture involving the head of the first metatarsal. IMPRESSION: 1. Bony and soft tissue amputation of first and second digit as above. Comminuted displaced fracture involving head of first proximal phalanx with additional cortical fracture involving head of first metatarsal. Electronically Signed   By: Jasmine Pang M.D.   On: 02/05/2021 21:34    Review of Systems  All other systems reviewed and are negative.   Blood pressure 133/86, pulse 71, temperature 99.2 F (37.3 C), temperature source Oral, resp. rate 20, height 5\' 9"  (1.753 m), weight 74 kg, SpO2 98 %. Physical Exam  Examination patient was alert and oriented no adenopathy well-dressed normal affect normal respiratory effort.  He mangle destroyed great toe and second toe  with soft tissue injury to the MTP.  He has good pulses. Assessment/Plan Lawnmower injury left foot with soft tissue injury and partial amputations of the great toe and second toe.  Plan.  We will plan for completion of the amputation of the great toe and second toe.  Discussed that there is significant soft tissue injuries and there may be difficulty with wound healing.  Risk and benefits were discussed including infection and need for additional surgery.  Patient states he understands wished proceed at this time.  , MD 02/06/2021, 8:22 AM

## 2021-02-06 NOTE — Anesthesia Procedure Notes (Signed)
Procedure Name: MAC Date/Time: 02/06/2021 12:36 PM Performed by: Gwyndolyn Saxon, CRNA Pre-anesthesia Checklist: Patient identified, Emergency Drugs available, Suction available and Patient being monitored Patient Re-evaluated:Patient Re-evaluated prior to induction Oxygen Delivery Method: Simple face mask

## 2021-02-06 NOTE — Evaluation (Signed)
Physical Therapy Evaluation Patient Details Name: Jimmy Allen MRN: [id number] DOB: Jun 17, 1976 Today's Date: 02/06/2021   History of Present Illness  The pt is a 45 yo male presenting 5/5 due to accident with lawn mower resulting in amputation of L great and second toe. Pt is now s/p completion of amputations on 5/6. No significant PMH.    Clinical Impression  Pt in bed upon arrival of PT, agreeable to evaluation at this time. 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 The pt now presents with minor limitations in functional mobility and dynamic stability due to above dx, but is safe to return home with use of crutches and intermittent supervision from family. The pt was able to demo good safety and independence with use of crutches for sit-stand transfers and hallway ambulation without need for assist or LOB. The pt was educated on fall prevention at home, and TDWB status for LLE, pt verbalized understanding of all education, reports no questions or concerns about returning home at this time. No further acute PT needs, the pt will be safe to return home with family once medically cleared. Thank you for the consult, please feel free to re-consult if change in status.     Follow Up Recommendations No PT follow up;Supervision for mobility/OOB    Equipment Recommendations  Crutches    Recommendations for Other Services       Precautions / Restrictions Precautions Precautions: Fall Required Braces or Orthoses: Other Brace Other Brace: L post op shoe Restrictions Weight Bearing Restrictions: Yes LLE Weight Bearing: Touchdown weight bearing      Mobility  Bed Mobility Overal bed mobility: Independent                  Transfers Overall transfer level: Modified independent Equipment used: Crutches             General transfer comment: supervision initially for  safety, progressed to modI within session  Ambulation/Gait Ambulation/Gait assistance: Supervision Gait Distance (Feet): 150 Feet Assistive device: Crutches   Gait velocity: 0.53 m/s Gait velocity interpretation: <1.8 ft/sec, indicate of risk for recurrent falls General Gait Details: swing-through gait pattern, good safety with crutches, maintained NWB LLE through eval      Balance Overall balance assessment: Mild deficits observed, not formally tested                                           Pertinent Vitals/Pain Pain Assessment: Faces Faces Pain Scale: Hurts little more Pain Location: L foot at incision site Pain Descriptors / Indicators: Discomfort;Sore Pain Intervention(s): Limited activity within patient's tolerance;Monitored during session;Repositioned    Home Living Family/patient expects to be discharged to:: Private residence Living Arrangements: Spouse/significant other Available Help at Discharge: Family;Available PRN/intermittently Type of Home: House Home Access: Ramped entrance     Home Layout: One level Home Equipment: Shower seat      Prior Function Level of Independence: Independent         Comments: working Psychologist, counselling   Dominant Hand: Right    Extremity/Trunk Assessment   Upper Extremity Assessment Upper Extremity Assessment: Overall WFL for tasks assessed    Lower Extremity Assessment Lower Extremity Assessment: Overall WFL for tasks assessed;LLE deficits/detail LLE Deficits / Details: limited by TDWB, good  strength at knee and hip, pt denies difference in sensation in lower leg LLE Sensation: WNL LLE Coordination: WNL    Cervical / Trunk Assessment Cervical / Trunk Assessment: Normal  Communication   Communication: No difficulties  Cognition Arousal/Alertness: Awake/alert Behavior During Therapy: WFL for tasks assessed/performed Overall Cognitive Status: Within Functional Limits for tasks  assessed                                 General Comments: mild impuslivity, but pt with good safety.      General Comments General comments (skin integrity, edema, etc.): VSS, pt eager to return home    Exercises     Assessment/Plan    PT Assessment Patent does not need any further PT services  PT Problem List         PT Treatment Interventions      PT Goals (Current goals can be found in the Care Plan section)  Acute Rehab PT Goals Patient Stated Goal: return home asap PT Goal Formulation: With patient Time For Goal Achievement: 02/13/21 Potential to Achieve Goals: Good     AM-PAC PT "6 Clicks" Mobility  Outcome Measure Help needed turning from your back to your side while in a flat bed without using bedrails?: None Help needed moving from lying on your back to sitting on the side of a flat bed without using bedrails?: None Help needed moving to and from a bed to a chair (including a wheelchair)?: A Little Help needed standing up from a chair using your arms (e.g., wheelchair or bedside chair)?: None Help needed to walk in hospital room?: A Little Help needed climbing 3-5 steps with a railing? : A Little 6 Click Score: 21    End of Session Equipment Utilized During Treatment: Gait belt Activity Tolerance: Patient tolerated treatment well Patient left: in bed;with call bell/phone within reach (sitting EOB) Nurse Communication: Mobility status;Patient requests pain meds (needs crutches) PT Visit Diagnosis: Unsteadiness on feet (R26.81);Pain Pain - Right/Left: Left Pain - part of body: Ankle and joints of foot    Time: 6195-0932 PT Time Calculation (min) (ACUTE ONLY): 22 min   Charges:   PT Evaluation $PT Eval Low Complexity: 1 Low          Jimmy Allen, PT, DPT   Acute Rehabilitation Department Pager #: [phone]  Gaetana Michaelis 02/06/2021, 5:36 PM

## 2021-02-06 NOTE — Op Note (Signed)
02/06/2021  1:36 PM  PATIENT:  Jimmy Allen    PRE-OPERATIVE DIAGNOSIS:  left foot traumatic amputation  POST-OPERATIVE DIAGNOSIS:  Same  PROCEDURE: Amputation of the great toe and second toe with local tissue rearrangement for wound closure 10 x 4 cm.  SURGEON:  Newt Minion, MD  PHYSICIAN ASSISTANT:None ANESTHESIA:   General  PREOPERATIVE INDICATIONS:  [name] is a  45 y.o. male with a diagnosis of left foot traumatic amputation who failed conservative measures and elected for surgical management.    The risks benefits and alternatives were discussed with the patient preoperatively including but not limited to the risks of infection, bleeding, nerve injury, cardiopulmonary complications, the need for revision surgery, among others, and the patient was willing to proceed.  OPERATIVE IMPLANTS: None  _0 @  OPERATIVE FINDINGS: Extensive soft tissue injury.  OPERATIVE PROCEDURE: Patient was brought the operating room and underwent a MAC anesthetic.  The left lower extremity was then prepped using DuraPrep draped into a sterile field a timeout was called.  Patient underwent local anesthesia with 30 cc of 1% lidocaine plain.  After adequate levels anesthesia were obtained the traumatized nonviable tissue was excised this left a wound that was 10 x 4 cm.  The great toe and second toe were amputated through the MTP joint.  Patient also had a cut from the lawnmower that amputated the dorsal aspect of the first metatarsal head.  An oscillating saw was then used to resect bone down to healthy viable metaphyseal bone.  The wound was irrigated with normal saline all contamination was removed.  Local tissue rearrangement was used to close the wound with 2-0 nylon that was 4 x 10 cm.  A sterile dressing was applied patient was taken the PACU in stable condition.   DISCHARGE PLANNING:  Antibiotic duration: Continue IV antibiotics for 24 hours  Weightbearing:  Touchdown weightbearing on the left  Pain medication: Opioid pathway  Dressing care/ Wound VAC: Dry dressing  Ambulatory devices: Walker or crutches  Discharge to: Anticipate discharge to home tomorrow.  Follow-up: In the office 1 week post operative.

## 2021-02-06 NOTE — Anesthesia Preprocedure Evaluation (Addendum)
Anesthesia Evaluation  Patient identified by MRN, date of birth, ID band Patient awake    Reviewed: Allergy & Precautions, NPO status , Patient's Chart, lab work & pertinent test results  Airway Mallampati: II  TM Distance: >3 FB Neck ROM: Full    Dental no notable dental hx.    Pulmonary Current Smoker and Patient abstained from smoking.,    Pulmonary exam normal breath sounds clear to auscultation       Cardiovascular negative cardio ROS Normal cardiovascular exam Rhythm:Regular Rate:Normal     Neuro/Psych negative neurological ROS  negative psych ROS   GI/Hepatic negative GI ROS, (+)     substance abuse  ,   Endo/Other  negative endocrine ROS  Renal/GU negative Renal ROS     Musculoskeletal negative musculoskeletal ROS (+)   Abdominal   Peds  Hematology negative hematology ROS (+)   Anesthesia Other Findings left foot traumatic amputation  Reproductive/Obstetrics                             Anesthesia Physical Anesthesia Plan  ASA: II  Anesthesia Plan: MAC   Post-op Pain Management:    Induction: Intravenous  PONV Risk Score and Plan: 0 and Ondansetron, Dexamethasone, Midazolam, Treatment may vary due to age or medical condition and Propofol infusion  Airway Management Planned: Simple Face Mask  Additional Equipment:   Intra-op Plan:   Post-operative Plan:   Informed Consent: I have reviewed the patients History and Physical, chart, labs and discussed the procedure including the risks, benefits and alternatives for the proposed anesthesia with the patient or authorized representative who has indicated his/her understanding and acceptance.     Dental advisory given  Plan Discussed with: CRNA  Anesthesia Plan Comments:        Anesthesia Quick Evaluation

## 2021-02-07 NOTE — Anesthesia Postprocedure Evaluation (Signed)
Anesthesia Post Note  Patient: Jimmy Allen  Procedure(s) Performed: Revision AMPUTATION FOOT left and multiple toes (Left Foot)     Patient location during evaluation: PACU Anesthesia Type: MAC Level of consciousness: awake Pain management: pain level controlled Vital Signs Assessment: post-procedure vital signs reviewed and stable Respiratory status: spontaneous breathing, nonlabored ventilation, respiratory function stable and patient connected to nasal cannula oxygen Cardiovascular status: stable and blood pressure returned to baseline Postop Assessment: no apparent nausea or vomiting Anesthetic complications: no   No complications documented.  Last Vitals:  Vitals:   02/06/21 2100 02/07/21 0414  BP: 124/85 (!) 120/92  Pulse: 64 72  Resp: 15 16  Temp: 37 C 37 C  SpO2: 96% 98%    Last Pain:  Vitals:   02/07/21 0531  TempSrc:   PainSc: 10-Worst pain ever                 Zandon Talton P Chrystel Barefield

## 2021-02-07 NOTE — Progress Notes (Signed)
Patient ID: Jimmy Allen, male   DOB: 04-24-1976, 45 y.o.   MRN: [id number] Patient is sitting up in bed he is comfortable the dressing is clean and dry plan for discharge to home today prescription sent to [facility] on [facility]

## 2021-02-07 NOTE — Discharge Summary (Signed)
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 male who was admitted 02/05/2021 with a chief complaint of lawnmower amputation of toes left foot, with a final diagnosis of left foot traumatic amputation.  Patient was brought to the operating room on 02/06/2021 and underwent Procedure(s): Revision AMPUTATION FOOT left and multiple toes.    Patient was given perioperative antibiotics:  Anti-infectives (From admission, onward)   Start     Dose/Rate Route Frequency Ordered Stop   02/06/21 0915  ceFAZolin (ANCEF) IVPB 2g/100 mL premix  Status:  Discontinued        2 g 200 mL/hr over 30 Minutes Intravenous On call to O.R. 02/06/21 0825 02/06/21 0842   02/06/21 0845  ceFAZolin (ANCEF) IVPB 2g/100 mL premix  Status:  Discontinued        2 g 200 mL/hr over 30 Minutes Intravenous To ShortStay Surgical 02/06/21 0825 02/06/21 1417   02/06/21 0800  ceFAZolin (ANCEF) IVPB 2g/100 mL premix  Status:  Discontinued        2 g 200 mL/hr over 30 Minutes Intravenous To Short Stay 02/06/21 0021 02/06/21 1417   02/06/21 0600  ceFAZolin (ANCEF) IVPB 2g/100 mL premix       "And" Linked Group Details   2 g 200 mL/hr over 30 Minutes Intravenous Every 8 hours 02/05/21 2311 02/07/21 0559   02/05/21 2315  metroNIDAZOLE (FLAGYL) IVPB 500 mg       "And" Linked Group Details   500 mg 100 mL/hr over 60 Minutes Intravenous Every 8 hours 02/05/21 2311 02/06/21 2159   02/05/21 2045  ceFAZolin (ANCEF) IVPB 2g/100 mL premix        2 g 200 mL/hr over 30 Minutes Intravenous  Once 02/05/21 2038 02/05/21 2219    .  Patient was given sequential compression devices, early ambulation, and aspirin for DVT prophylaxis.  Recent vital signs:  Patient Vitals for  the past 24 hrs:  BP Temp Temp src Pulse Resp SpO2  02/07/21 0414 (!) 120/92 98.6 F (37 C) -- 72 16 98 %  02/06/21 2100 124/85 98.6 F (37 C) Oral 64 15 96 %  02/06/21 1420 124/86 97.9 F (36.6 C) Oral 66 14 100 %  02/06/21 1345 (!) 123/92 97.7 F (36.5 C) -- 73 13 95 %  02/06/21 1330 116/82 -- -- 66 10 95 %  02/06/21 1315 108/78 97.7 F (36.5 C) -- 72 11 97 %  02/06/21 1104 (!) 124/91 97.7 F (36.5 C) Oral 72 16 100 %  .  Recent laboratory studies: DG Foot 2 Views Left  Result Date: 02/05/2021 CLINICAL DATA:  Toe amputation by lawnmower EXAM: LEFT FOOT - 2 VIEW COMPARISON:  06/21/2020 FINDINGS: Bony and soft tissue amputation of the first and second digits at the level of IP joints. Residual base of second middle phalanx. Comminuted displaced fracture involving the head of the first proximal phalanx. Additional cortical fracture involving the head of the first metatarsal. IMPRESSION: 1. Bony and soft tissue amputation of first and second digit as above. Comminuted displaced fracture involving head of first proximal phalanx with additional cortical fracture involving head of first metatarsal. Electronically Signed   By: Jasmine Pang M.D.   On: 02/05/2021 21:34    Discharge Medications:  Allergies as of 02/07/2021   No Known Allergies     Medication List    TAKE these medications   methocarbamol 500 MG tablet Commonly known as: ROBAXIN Take 1 tablet (500 mg total) by mouth 2 (two) times daily.   oxyCODONE 5 MG immediate release tablet Commonly known as: Oxy IR/ROXICODONE Take 1 tablet (5 mg total) by mouth once as needed (for pain score of 1-4).     ASK your doctor about these medications   nicotine 21 mg/24hr patch Commonly known as: NICODERM CQ - dosed in mg/24 hours Place 1 patch (21 mg total) onto the skin once for 1 dose. Ask about: Should I take this medication?            Discharge Care Instructions  (From admission, onward)         Start     Ordered    02/07/21 0000  Non weight bearing       Question Answer Comment  Laterality left   Extremity Lower      02/07/21 0856          Diagnostic Studies: DG Foot 2 Views Left  Result Date: 02/05/2021 CLINICAL DATA:  Toe amputation by lawnmower EXAM: LEFT FOOT - 2 VIEW COMPARISON:  06/21/2020 FINDINGS: Bony and soft tissue amputation of the first and second digits at the level of IP joints. Residual base of second middle phalanx. Comminuted displaced fracture involving the head of the first proximal phalanx. Additional cortical fracture involving the head of the first metatarsal. IMPRESSION: 1. Bony and soft tissue amputation of first and second digit as above. Comminuted displaced fracture involving head of first proximal phalanx with additional cortical fracture involving head of first metatarsal. Electronically Signed   By: Jasmine Pang M.D.   On: 02/05/2021 21:34    Patient benefited maximally from their hospital stay and there were no complications.     Disposition: Discharge disposition: 01-Home or Self Care      Discharge Instructions    Call MD / Call 911   Complete by: As directed    If you experience chest pain or shortness of breath, CALL 911 and be transported to the hospital emergency room.  If you develope a fever above 101 F, pus (white drainage) or increased drainage or redness at the wound, or calf pain, call your surgeon's office.   Call MD / Call 911   Complete by: As directed    If you experience chest pain or shortness of breath, CALL 911 and be transported to the hospital emergency room.  If you develope a fever above 101 F, pus (white drainage) or increased drainage or redness at the wound, or calf pain, call your surgeon's office.   Constipation Prevention   Complete by: As directed    Drink plenty of fluids.  Prune juice may be helpful.  You may use a stool softener, such as Colace (over the counter) 100 mg twice a day.  Use MiraLax (over the counter) for  constipation as needed.   Constipation Prevention   Complete by: As directed    Drink plenty of fluids.  Prune juice may be helpful.  You may use a stool softener, such as Colace (over the counter) 100 mg twice a day.  Use MiraLax (over the counter) for constipation as needed.   Diet - low sodium heart healthy   Complete by: As directed    Diet - low sodium heart healthy   Complete by: As directed  Discharge instructions   Complete by: As directed    Keep dressing dry and in place   Increase activity slowly as tolerated   Complete by: As directed    Increase activity slowly as tolerated   Complete by: As directed    Non weight bearing   Complete by: As directed    Laterality: left   Extremity: Lower   Post-operative opioid taper instructions:   Complete by: As directed    POST-OPERATIVE OPIOID TAPER INSTRUCTIONS: It is important to wean off of your opioid medication as soon as possible. If you do not need pain medication after your surgery it is ok to stop day one. Opioids include: Codeine, Hydrocodone(Norco, Vicodin), Oxycodone(Percocet, oxycontin) and hydromorphone amongst others.  Long term and even short term use of opiods can cause: Increased pain response Dependence Constipation Depression Respiratory depression And more.  Withdrawal symptoms can include Flu like symptoms Nausea, vomiting And more Techniques to manage these symptoms Hydrate well Eat regular healthy meals Stay active Use relaxation techniques(deep breathing, meditating, yoga) Do Not substitute Alcohol to help with tapering If you have been on opioids for less than two weeks and do not have pain than it is ok to stop all together.  Plan to wean off of opioids This plan should start within one week post op of your joint replacement. Maintain the same interval or time between taking each dose and first decrease the dose.  Cut the total daily intake of opioids by one tablet each day Next start to  increase the time between doses. The last dose that should be eliminated is the evening dose.      Post-operative opioid taper instructions:   Complete by: As directed    POST-OPERATIVE OPIOID TAPER INSTRUCTIONS: It is important to wean off of your opioid medication as soon as possible. If you do not need pain medication after your surgery it is ok to stop day one. Opioids include: Codeine, Hydrocodone(Norco, Vicodin), Oxycodone(Percocet, oxycontin) and hydromorphone amongst others.  Long term and even short term use of opiods can cause: Increased pain response Dependence Constipation Depression Respiratory depression And more.  Withdrawal symptoms can include Flu like symptoms Nausea, vomiting And more Techniques to manage these symptoms Hydrate well Eat regular healthy meals Stay active Use relaxation techniques(deep breathing, meditating, yoga) Do Not substitute Alcohol to help with tapering If you have been on opioids for less than two weeks and do not have pain than it is ok to stop all together.  Plan to wean off of opioids This plan should start within one week post op of your joint replacement. Maintain the same interval or time between taking each dose and first decrease the dose.  Cut the total daily intake of opioids by one tablet each day Next start to increase the time between doses. The last dose that should be eliminated is the evening dose.         Follow-up Information    [facility], [place], [place] In 1 week.   Specialty: Orthopedic Surgery Contact information: [address] [place] [address] [phone]                Signed: Nadara Mustard 02/07/2021, 8:57 AM

## 2021-02-07 NOTE — Plan of Care (Signed)
  Problem: Education: Goal: Knowledge of General Education information will improve Description: Including pain rating scale, medication(s)/side effects and non-pharmacologic comfort measures 02/07/2021 0056 by Charlotte Sanes, RN Outcome: Progressing 02/07/2021 0055 by Charlotte Sanes, RN Outcome: Progressing   Problem: Clinical Measurements: Goal: Will remain free from infection Outcome: Progressing   Problem: Pain Managment: Goal: General experience of comfort will improve Outcome: Progressing   Problem: Activity: Goal: Risk for activity intolerance will decrease Outcome: Progressing   Problem: Skin Integrity: Goal: Risk for impaired skin integrity will decrease Outcome: Progressing

## 2021-02-07 NOTE — Plan of Care (Signed)
  Problem: Education: Goal: Knowledge of General Education information will improve Description: Including pain rating scale, medication(s)/side effects and non-pharmacologic comfort measures Outcome: Adequate for Discharge   Problem: Clinical Measurements: Goal: Ability to maintain clinical measurements within normal limits will improve Outcome: Adequate for Discharge Goal: Will remain free from infection Outcome: Adequate for Discharge Goal: Diagnostic test results will improve Outcome: Adequate for Discharge Goal: Respiratory complications will improve Outcome: Adequate for Discharge Goal: Cardiovascular complication will be avoided Outcome: Adequate for Discharge   Problem: Education: Goal: Knowledge of General Education information will improve Description: Including pain rating scale, medication(s)/side effects and non-pharmacologic comfort measures Outcome: Adequate for Discharge   Problem: Health Behavior/Discharge Planning: Goal: Ability to manage health-related needs will improve Outcome: Adequate for Discharge   Problem: Clinical Measurements: Goal: Ability to maintain clinical measurements within normal limits will improve Outcome: Adequate for Discharge Goal: Will remain free from infection Outcome: Adequate for Discharge Goal: Diagnostic test results will improve Outcome: Adequate for Discharge Goal: Respiratory complications will improve Outcome: Adequate for Discharge Goal: Cardiovascular complication will be avoided Outcome: Adequate for Discharge   Problem: Activity: Goal: Risk for activity intolerance will decrease Outcome: Adequate for Discharge   Problem: Nutrition: Goal: Adequate nutrition will be maintained Outcome: Adequate for Discharge   Problem: Coping: Goal: Level of anxiety will decrease Outcome: Adequate for Discharge   Problem: Elimination: Goal: Will not experience complications related to bowel motility Outcome: Adequate for  Discharge Goal: Will not experience complications related to urinary retention Outcome: Adequate for Discharge   Problem: Pain Managment: Goal: General experience of comfort will improve Outcome: Adequate for Discharge   Problem: Safety: Goal: Ability to remain free from injury will improve Outcome: Adequate for Discharge   Problem: Skin Integrity: Goal: Risk for impaired skin integrity will decrease Outcome: Adequate for Discharge

## 2021-02-08 ENCOUNTER — Encounter (HOSPITAL_COMMUNITY): Payer: Self-pay | Admitting: Orthopedic Surgery

## 2021-02-16 ENCOUNTER — Other Ambulatory Visit: Payer: Self-pay

## 2021-02-16 ENCOUNTER — Emergency Department (HOSPITAL_COMMUNITY): Payer: Self-pay

## 2021-02-16 ENCOUNTER — Emergency Department (HOSPITAL_COMMUNITY)
Admission: EM | Admit: 2021-02-16 | Discharge: 2021-02-16 | Discharge status: Against Medical Advice | Payer: Self-pay | Attending: Student | Admitting: Student

## 2021-02-16 ENCOUNTER — Encounter (HOSPITAL_COMMUNITY): Payer: Self-pay

## 2021-02-16 DIAGNOSIS — Z09 Encounter for follow-up examination after completed treatment for conditions other than malignant neoplasm: Secondary | ICD-10-CM | POA: Insufficient documentation

## 2021-02-16 DIAGNOSIS — Z5321 Procedure and treatment not carried out due to patient leaving prior to being seen by health care provider: Secondary | ICD-10-CM | POA: Insufficient documentation

## 2021-02-16 MED ORDER — IBUPROFEN 800 MG PO TABS: 800.0000 mg | ORAL_TABLET | Freq: Once | ORAL | Status: DC

## 2021-02-16 NOTE — ED Notes (Signed)
Pt called multiple times with no answer, moving OTF. 

## 2021-02-16 NOTE — ED Triage Notes (Signed)
Pt presents to ED for a f/u of Left foot surgery.pt reports Dr. Lajoyce Corners instructed him to f/u in one week. Pt unable to get through to his office. Pt here for assistance. Pt denies any concerns or complications.
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
## 2022-02-18 ENCOUNTER — Inpatient Hospital Stay (HOSPITAL_COMMUNITY)
Admission: EM | Admit: 2022-02-18 | Discharge: 2022-02-20 | DRG: 917 | Disposition: A | Discharge status: Home / Self Care | Payer: Self-pay | Attending: Pulmonary Disease | Admitting: Pulmonary Disease

## 2022-02-18 ENCOUNTER — Emergency Department (HOSPITAL_COMMUNITY): Payer: Self-pay

## 2022-02-18 DIAGNOSIS — F10129 Alcohol abuse with intoxication, unspecified: Secondary | ICD-10-CM | POA: Diagnosis present

## 2022-02-18 DIAGNOSIS — F17200 Nicotine dependence, unspecified, uncomplicated: Secondary | ICD-10-CM | POA: Diagnosis present

## 2022-02-18 DIAGNOSIS — E876 Hypokalemia: Secondary | ICD-10-CM | POA: Diagnosis present

## 2022-02-18 DIAGNOSIS — R1110 Vomiting, unspecified: Secondary | ICD-10-CM | POA: Diagnosis not present

## 2022-02-18 DIAGNOSIS — I10 Essential (primary) hypertension: Secondary | ICD-10-CM | POA: Diagnosis not present

## 2022-02-18 DIAGNOSIS — T405X1A Poisoning by cocaine, accidental (unintentional), initial encounter: Principal | ICD-10-CM | POA: Diagnosis present

## 2022-02-18 DIAGNOSIS — T402X4A Poisoning by other opioids, undetermined, initial encounter: Principal | ICD-10-CM

## 2022-02-18 DIAGNOSIS — R0681 Apnea, not elsewhere classified: Secondary | ICD-10-CM | POA: Diagnosis not present

## 2022-02-18 DIAGNOSIS — Y907 Blood alcohol level of 200-239 mg/100 ml: Secondary | ICD-10-CM | POA: Diagnosis present

## 2022-02-18 DIAGNOSIS — F1210 Cannabis abuse, uncomplicated: Secondary | ICD-10-CM | POA: Diagnosis present

## 2022-02-18 DIAGNOSIS — G928 Other toxic encephalopathy: Secondary | ICD-10-CM | POA: Diagnosis present

## 2022-02-18 DIAGNOSIS — E872 Acidosis, unspecified: Secondary | ICD-10-CM | POA: Diagnosis present

## 2022-02-18 DIAGNOSIS — T50901A Poisoning by unspecified drugs, medicaments and biological substances, accidental (unintentional), initial encounter: Secondary | ICD-10-CM | POA: Diagnosis present

## 2022-02-18 DIAGNOSIS — E861 Hypovolemia: Secondary | ICD-10-CM | POA: Diagnosis present

## 2022-02-18 DIAGNOSIS — R4020 Unspecified coma: Secondary | ICD-10-CM | POA: Diagnosis present

## 2022-02-18 DIAGNOSIS — Y92410 Unspecified street and highway as the place of occurrence of the external cause: Secondary | ICD-10-CM

## 2022-02-18 DIAGNOSIS — Z20822 Contact with and (suspected) exposure to covid-19: Secondary | ICD-10-CM | POA: Diagnosis present

## 2022-02-18 DIAGNOSIS — T40601A Poisoning by unspecified narcotics, accidental (unintentional), initial encounter: Secondary | ICD-10-CM | POA: Diagnosis present

## 2022-02-18 DIAGNOSIS — Z79899 Other long term (current) drug therapy: Secondary | ICD-10-CM

## 2022-02-18 DIAGNOSIS — N179 Acute kidney failure, unspecified: Secondary | ICD-10-CM | POA: Diagnosis present

## 2022-02-18 LAB — COMPREHENSIVE METABOLIC PANEL
ALT: 39 U/L (ref 0–44)
AST: 76 U/L — ABNORMAL HIGH (ref 15–41)
Albumin: 3.3 g/dL — ABNORMAL LOW (ref 3.5–5.0)
Alkaline Phosphatase: 36 U/L — ABNORMAL LOW (ref 38–126)
Anion gap: 15 (ref 5–15)
BUN: 9 mg/dL (ref 6–20)
CO2: 17 mmol/L — ABNORMAL LOW (ref 22–32)
Calcium: 7.9 mg/dL — ABNORMAL LOW (ref 8.9–10.3)
Chloride: 103 mmol/L (ref 98–111)
Creatinine, Ser: 1.22 mg/dL (ref 0.61–1.24)
GFR, Estimated: >60 mL/min (ref >60)
Glucose, Bld: 119 mg/dL — ABNORMAL HIGH (ref 70–99)
Potassium: 3.3 mmol/L — ABNORMAL LOW (ref 3.5–5.1)
Sodium: 135 mmol/L (ref 135–145)
Total Bilirubin: 0.9 mg/dL (ref 0.3–1.2)
Total Protein: 5.6 g/dL — ABNORMAL LOW (ref 6.5–8.1)

## 2022-02-18 LAB — I-STAT CHEM 8, ED
BUN: 8 mg/dL (ref 6–20)
Calcium, Ion: 0.97 mmol/L — ABNORMAL LOW (ref 1.15–1.40)
Chloride: 100 mmol/L (ref 98–111)
Creatinine, Ser: 1.4 mg/dL — ABNORMAL HIGH (ref 0.61–1.24)
Glucose, Bld: 120 mg/dL — ABNORMAL HIGH (ref 70–99)
HCT: 44 % (ref 39.0–52.0)
Hemoglobin: 15 g/dL (ref 13.0–17.0)
Potassium: 3.3 mmol/L — ABNORMAL LOW (ref 3.5–5.1)
Sodium: 135 mmol/L (ref 135–145)
TCO2: 20 mmol/L — ABNORMAL LOW (ref 22–32)

## 2022-02-18 LAB — URINALYSIS, ROUTINE W REFLEX MICROSCOPIC
Bacteria, UA: NONE SEEN
Bilirubin Urine: NEGATIVE
Glucose, UA: NEGATIVE mg/dL
Ketones, ur: NEGATIVE mg/dL
Leukocytes,Ua: NEGATIVE
Nitrite: NEGATIVE
Protein, ur: NEGATIVE mg/dL
Specific Gravity, Urine: 1.004 — ABNORMAL LOW (ref 1.005–1.030)
pH: 5 (ref 5.0–8.0)

## 2022-02-18 LAB — CBC
HCT: 43.5 % (ref 39.0–52.0)
Hemoglobin: 14.4 g/dL (ref 13.0–17.0)
MCH: 31 pg (ref 26.0–34.0)
MCHC: 33.1 g/dL (ref 30.0–36.0)
MCV: 93.5 fL (ref 80.0–100.0)
Platelets: 253 10*3/uL (ref 150–400)
RBC: 4.65 MIL/uL (ref 4.22–5.81)
RDW: 13.4 % (ref 11.5–15.5)
WBC: 10.7 10*3/uL — ABNORMAL HIGH (ref 4.0–10.5)
nRBC: 0 % (ref 0.0–0.2)

## 2022-02-18 LAB — PROTIME-INR
INR: 1 (ref 0.8–1.2)
Prothrombin Time: 12.9 seconds (ref 11.4–15.2)

## 2022-02-18 LAB — RAPID URINE DRUG SCREEN, HOSP PERFORMED
Amphetamines: NOT DETECTED
Barbiturates: NOT DETECTED
Benzodiazepines: NOT DETECTED
Cocaine: POSITIVE — AB
Opiates: NOT DETECTED
Tetrahydrocannabinol: POSITIVE — AB

## 2022-02-18 LAB — CBG MONITORING, ED: Glucose-Capillary: 133 mg/dL — ABNORMAL HIGH (ref 70–99)

## 2022-02-18 LAB — LACTIC ACID, PLASMA: Lactic Acid, Venous: 4.7 mmol/L (ref 0.5–1.9)

## 2022-02-18 LAB — SAMPLE TO BLOOD BANK

## 2022-02-18 LAB — ETHANOL: Alcohol, Ethyl (B): 237 mg/dL — ABNORMAL HIGH (ref <10)

## 2022-02-18 MED ORDER — NALOXONE HCL 2 MG/2ML IJ SOSY
0.5000 mg | PREFILLED_SYRINGE | INTRAMUSCULAR | Status: AC | PRN
  Administered 2022-02-18 (×2): 0.5 mg via INTRAVENOUS
  Filled 2022-02-18: qty 2

## 2022-02-18 MED ORDER — SODIUM CHLORIDE 0.9 % IV BOLUS
1000.0000 mL | Freq: Once | INTRAVENOUS | Status: AC
Start: 2022-02-18 — End: 2022-02-18
  Administered 2022-02-18: 1000 mL via INTRAVENOUS

## 2022-02-18 MED ORDER — NALOXONE HCL 2 MG/2ML IJ SOSY
1.0000 mg | PREFILLED_SYRINGE | Freq: Once | INTRAMUSCULAR | Status: AC
  Administered 2022-02-18: 1 mg via INTRAVENOUS
  Filled 2022-02-18: qty 2

## 2022-02-18 MED ORDER — ADENOSINE 6 MG/2ML IV SOLN
6.0000 mg | Freq: Once | INTRAVENOUS | Status: DC
  Filled 2022-02-18: qty 2

## 2022-02-18 MED ORDER — LACTATED RINGERS IV BOLUS
1000.0000 mL | Freq: Once | INTRAVENOUS | Status: AC
  Administered 2022-02-18: 1000 mL via INTRAVENOUS

## 2022-02-18 MED ORDER — POTASSIUM CHLORIDE CRYS ER 20 MEQ PO TBCR
40.0000 meq | EXTENDED_RELEASE_TABLET | Freq: Once | ORAL | Status: DC
Start: 2022-02-18 — End: 2022-02-19

## 2022-02-18 MED ORDER — NALOXONE HCL 2 MG/2ML IJ SOSY
2.0000 mg | PREFILLED_SYRINGE | Freq: Once | INTRAMUSCULAR | Status: AC
  Administered 2022-02-18: 2 mg via INTRAVENOUS
  Filled 2022-02-18: qty 2

## 2022-02-18 MED ORDER — NALOXONE HCL 4 MG/10ML IJ SOLN
0.4000 mg/h | INTRAVENOUS | Status: DC
  Administered 2022-02-18: 1 mg/h via INTRAVENOUS
  Administered 2022-02-19: 0.6 mg/h via INTRAVENOUS
  Administered 2022-02-19 (×2): 1 mg/h via INTRAVENOUS
  Filled 2022-02-18 (×4): qty 10
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
## 2022-02-18 NOTE — Progress Notes (Signed)
   02/18/22 1829  Clinical Encounter Type  Visited With Patient not available;Health care provider  Visit Type ED;Trauma;Initial (Level 2)  Referral From Nurse  Consult/Referral To Chaplain Melvenia Beam)   Responded to page in E.D. Trauma Room A for Level 2 Trauma.  Jimmy Allen being evaluated and treated by medical staff at this time, patient not seen by Chaplain. No family present at this time.  Staff will page Chaplain upon request of patient or family.  Chaplain Kaelea Gathright, M.Min., [phone].
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
## 2022-02-19 ENCOUNTER — Other Ambulatory Visit: Payer: Self-pay

## 2022-02-19 ENCOUNTER — Inpatient Hospital Stay (HOSPITAL_COMMUNITY): Payer: Self-pay

## 2022-02-19 ENCOUNTER — Encounter (HOSPITAL_COMMUNITY): Payer: Self-pay | Admitting: Internal Medicine

## 2022-02-19 DIAGNOSIS — Z79899 Other long term (current) drug therapy: Secondary | ICD-10-CM | POA: Diagnosis not present

## 2022-02-19 DIAGNOSIS — T50901A Poisoning by unspecified drugs, medicaments and biological substances, accidental (unintentional), initial encounter: Secondary | ICD-10-CM | POA: Diagnosis present

## 2022-02-19 DIAGNOSIS — R1110 Vomiting, unspecified: Secondary | ICD-10-CM | POA: Diagnosis not present

## 2022-02-19 DIAGNOSIS — F17200 Nicotine dependence, unspecified, uncomplicated: Secondary | ICD-10-CM | POA: Diagnosis present

## 2022-02-19 DIAGNOSIS — Y92410 Unspecified street and highway as the place of occurrence of the external cause: Secondary | ICD-10-CM | POA: Diagnosis not present

## 2022-02-19 DIAGNOSIS — T40601A Poisoning by unspecified narcotics, accidental (unintentional), initial encounter: Secondary | ICD-10-CM | POA: Diagnosis present

## 2022-02-19 DIAGNOSIS — G928 Other toxic encephalopathy: Secondary | ICD-10-CM | POA: Diagnosis present

## 2022-02-19 DIAGNOSIS — F10129 Alcohol abuse with intoxication, unspecified: Secondary | ICD-10-CM | POA: Diagnosis present

## 2022-02-19 DIAGNOSIS — N179 Acute kidney failure, unspecified: Secondary | ICD-10-CM | POA: Diagnosis present

## 2022-02-19 DIAGNOSIS — E861 Hypovolemia: Secondary | ICD-10-CM | POA: Diagnosis present

## 2022-02-19 DIAGNOSIS — T402X4A Poisoning by other opioids, undetermined, initial encounter: Secondary | ICD-10-CM | POA: Diagnosis present

## 2022-02-19 DIAGNOSIS — Y907 Blood alcohol level of 200-239 mg/100 ml: Secondary | ICD-10-CM | POA: Diagnosis present

## 2022-02-19 DIAGNOSIS — Z20822 Contact with and (suspected) exposure to covid-19: Secondary | ICD-10-CM | POA: Diagnosis present

## 2022-02-19 DIAGNOSIS — R4020 Unspecified coma: Secondary | ICD-10-CM | POA: Diagnosis present

## 2022-02-19 DIAGNOSIS — F1210 Cannabis abuse, uncomplicated: Secondary | ICD-10-CM | POA: Diagnosis present

## 2022-02-19 DIAGNOSIS — I10 Essential (primary) hypertension: Secondary | ICD-10-CM | POA: Diagnosis not present

## 2022-02-19 DIAGNOSIS — T405X1A Poisoning by cocaine, accidental (unintentional), initial encounter: Secondary | ICD-10-CM | POA: Diagnosis present

## 2022-02-19 DIAGNOSIS — E872 Acidosis, unspecified: Secondary | ICD-10-CM | POA: Diagnosis present

## 2022-02-19 DIAGNOSIS — E876 Hypokalemia: Secondary | ICD-10-CM | POA: Diagnosis present

## 2022-02-19 DIAGNOSIS — R0681 Apnea, not elsewhere classified: Secondary | ICD-10-CM | POA: Diagnosis not present

## 2022-02-19 LAB — BASIC METABOLIC PANEL
Anion gap: 13 (ref 5–15)
BUN: 8 mg/dL (ref 6–20)
CO2: 22 mmol/L (ref 22–32)
Calcium: 7.8 mg/dL — ABNORMAL LOW (ref 8.9–10.3)
Chloride: 101 mmol/L (ref 98–111)
Creatinine, Ser: 1.05 mg/dL (ref 0.61–1.24)
GFR, Estimated: >60 mL/min (ref >60)
Glucose, Bld: 140 mg/dL — ABNORMAL HIGH (ref 70–99)
Potassium: 4.2 mmol/L (ref 3.5–5.1)
Sodium: 136 mmol/L (ref 135–145)

## 2022-02-19 LAB — I-STAT ARTERIAL BLOOD GAS, ED
Acid-base deficit: 6 mmol/L — ABNORMAL HIGH (ref 0.0–2.0)
Bicarbonate: 21.3 mmol/L (ref 20.0–28.0)
Calcium, Ion: 1.09 mmol/L — ABNORMAL LOW (ref 1.15–1.40)
HCT: 42 % (ref 39.0–52.0)
Hemoglobin: 14.3 g/dL (ref 13.0–17.0)
O2 Saturation: 92 %
Potassium: 4.1 mmol/L (ref 3.5–5.1)
Sodium: 135 mmol/L (ref 135–145)
TCO2: 23 mmol/L (ref 22–32)
pCO2 arterial: 47.1 mmHg (ref 32–48)
pH, Arterial: 7.264 — ABNORMAL LOW (ref 7.35–7.45)
pO2, Arterial: 74 mmHg — ABNORMAL LOW (ref 83–108)

## 2022-02-19 LAB — CBC
HCT: 39.7 % (ref 39.0–52.0)
Hemoglobin: 13.1 g/dL (ref 13.0–17.0)
MCH: 31.2 pg (ref 26.0–34.0)
MCHC: 33 g/dL (ref 30.0–36.0)
MCV: 94.5 fL (ref 80.0–100.0)
Platelets: 233 10*3/uL (ref 150–400)
RBC: 4.2 MIL/uL — ABNORMAL LOW (ref 4.22–5.81)
RDW: 13.5 % (ref 11.5–15.5)
WBC: 14.3 10*3/uL — ABNORMAL HIGH (ref 4.0–10.5)
nRBC: 0 % (ref 0.0–0.2)

## 2022-02-19 LAB — MRSA NEXT GEN BY PCR, NASAL: MRSA by PCR Next Gen: NOT DETECTED

## 2022-02-19 LAB — MAGNESIUM
Magnesium: 1.6 mg/dL — ABNORMAL LOW (ref 1.7–2.4)
Magnesium: 1.9 mg/dL (ref 1.7–2.4)
Magnesium: 2 mg/dL (ref 1.7–2.4)

## 2022-02-19 LAB — HIV ANTIBODY (ROUTINE TESTING W REFLEX): HIV Screen 4th Generation wRfx: NONREACTIVE

## 2022-02-19 LAB — GLUCOSE, CAPILLARY: Glucose-Capillary: 121 mg/dL — ABNORMAL HIGH (ref 70–99)

## 2022-02-19 LAB — LACTIC ACID, PLASMA
Lactic Acid, Venous: 6 mmol/L (ref 0.5–1.9)
Lactic Acid, Venous: 1.3 mmol/L (ref 0.5–1.9)

## 2022-02-19 LAB — PHOSPHORUS: Phosphorus: 3.4 mg/dL (ref 2.5–4.6)

## 2022-02-19 LAB — RESP PANEL BY RT-PCR (FLU A&B, COVID) ARPGX2
Influenza A by PCR: NEGATIVE
Influenza B by PCR: NEGATIVE
SARS Coronavirus 2 by RT PCR: NEGATIVE

## 2022-02-19 MED ORDER — CHLORHEXIDINE GLUCONATE CLOTH 2 % EX PADS
6.0000 | MEDICATED_PAD | Freq: Every day | CUTANEOUS | Status: DC
  Administered 2022-02-19 (×2): 6 via TOPICAL

## 2022-02-19 MED ORDER — ONDANSETRON HCL 4 MG/2ML IJ SOLN
4.0000 mg | Freq: Four times a day (QID) | INTRAMUSCULAR | Status: DC | PRN
  Administered 2022-02-19 – 2022-02-20 (×3): 4 mg via INTRAVENOUS
  Filled 2022-02-19 (×3): qty 2

## 2022-02-19 MED ORDER — FOLIC ACID 1 MG PO TABS
1.0000 mg | ORAL_TABLET | Freq: Every day | ORAL | Status: DC
  Administered 2022-02-19 – 2022-02-20 (×2): 1 mg via ORAL
  Filled 2022-02-19 (×2): qty 1

## 2022-02-19 MED ORDER — DOCUSATE SODIUM 100 MG PO CAPS: 100.0000 mg | ORAL_CAPSULE | Freq: Two times a day (BID) | ORAL | Status: DC | PRN

## 2022-02-19 MED ORDER — LACTATED RINGERS IV BOLUS
1000.0000 mL | Freq: Once | INTRAVENOUS | Status: AC
Start: 2022-02-19 — End: 2022-02-19
  Administered 2022-02-19: 1000 mL via INTRAVENOUS

## 2022-02-19 MED ORDER — CALCIUM GLUCONATE-NACL 1-0.675 GM/50ML-% IV SOLN
1.0000 g | Freq: Once | INTRAVENOUS | Status: AC
  Administered 2022-02-19: 1000 mg via INTRAVENOUS
  Filled 2022-02-19: qty 50

## 2022-02-19 MED ORDER — THIAMINE HCL 100 MG PO TABS
100.0000 mg | ORAL_TABLET | Freq: Every day | ORAL | Status: DC
  Administered 2022-02-19 – 2022-02-20 (×2): 100 mg via ORAL
  Filled 2022-02-19 (×2): qty 1

## 2022-02-19 MED ORDER — AMLODIPINE BESYLATE 10 MG PO TABS
10.0000 mg | ORAL_TABLET | Freq: Every day | ORAL | Status: DC
  Administered 2022-02-19 – 2022-02-20 (×2): 10 mg via ORAL
  Filled 2022-02-19 (×2): qty 1

## 2022-02-19 MED ORDER — NICOTINE 21 MG/24HR TD PT24
21.0000 mg | MEDICATED_PATCH | Freq: Every day | TRANSDERMAL | Status: DC
  Administered 2022-02-19 – 2022-02-20 (×2): 21 mg via TRANSDERMAL
  Filled 2022-02-19 (×2): qty 1

## 2022-02-19 MED ORDER — HYDRALAZINE HCL 20 MG/ML IJ SOLN
10.0000 mg | INTRAMUSCULAR | Status: DC | PRN
  Administered 2022-02-19 – 2022-02-20 (×2): 10 mg via INTRAVENOUS
  Filled 2022-02-19 (×2): qty 1

## 2022-02-19 MED ORDER — SODIUM CHLORIDE 0.9 % IV BOLUS
1000.0000 mL | Freq: Once | INTRAVENOUS | Status: AC
  Administered 2022-02-19: 1000 mL via INTRAVENOUS

## 2022-02-19 MED ORDER — DEXTROSE IN LACTATED RINGERS 5 % IV SOLN: INTRAVENOUS | Status: DC

## 2022-02-19 MED ORDER — ADULT MULTIVITAMIN W/MINERALS CH
1.0000 | ORAL_TABLET | Freq: Every day | ORAL | Status: DC
  Administered 2022-02-19 – 2022-02-20 (×2): 1 via ORAL
  Filled 2022-02-19 (×2): qty 1

## 2022-02-19 MED ORDER — MAGNESIUM SULFATE 2 GM/50ML IV SOLN
2.0000 g | Freq: Once | INTRAVENOUS | Status: AC
  Administered 2022-02-19: 2 g via INTRAVENOUS
  Filled 2022-02-19: qty 50

## 2022-02-19 MED ORDER — POLYETHYLENE GLYCOL 3350 17 G PO PACK: 17.0000 g | PACK | Freq: Every day | ORAL | Status: DC | PRN

## 2022-02-19 MED ORDER — NALOXONE HCL 0.4 MG/ML IJ SOLN: 0.4000 mg | INTRAMUSCULAR | Status: DC | PRN

## 2022-02-19 MED ORDER — ORAL CARE MOUTH RINSE
15.0000 mL | Freq: Two times a day (BID) | OROMUCOSAL | Status: DC
  Administered 2022-02-19 – 2022-02-20 (×4): 15 mL via OROMUCOSAL

## 2022-02-19 MED ORDER — LABETALOL HCL 5 MG/ML IV SOLN
10.0000 mg | INTRAVENOUS | Status: DC | PRN
  Administered 2022-02-19: 10 mg via INTRAVENOUS
  Filled 2022-02-19 (×2): qty 4

## 2022-02-19 MED ORDER — HYDRALAZINE HCL 20 MG/ML IJ SOLN
10.0000 mg | Freq: Four times a day (QID) | INTRAMUSCULAR | Status: DC | PRN
  Administered 2022-02-19: 10 mg via INTRAVENOUS
  Filled 2022-02-19: qty 1

## 2022-02-19 MED ORDER — ENOXAPARIN SODIUM 40 MG/0.4ML IJ SOSY
40.0000 mg | PREFILLED_SYRINGE | Freq: Every day | INTRAMUSCULAR | Status: DC
  Administered 2022-02-19 – 2022-02-20 (×2): 40 mg via SUBCUTANEOUS
  Filled 2022-02-19 (×2): qty 0.4

## 2022-02-19 MED ORDER — CLONIDINE HCL 0.2 MG PO TABS
0.1000 mg | ORAL_TABLET | Freq: Three times a day (TID) | ORAL | Status: DC
  Administered 2022-02-19: 0.1 mg via ORAL
  Filled 2022-02-19: qty 1

## 2022-02-19 NOTE — Progress Notes (Signed)
eLink Physician-Brief Progress Note Patient Name: Jimmy Allen DOB: March 24, 1976 MRN: [id number]   Date of Service  02/19/2022  HPI/Events of Note  Episode of emesis - Nursing request for anti-emetic. QTc interval = 0.483 seconds.   eICU Interventions  Plan: Zofran 4 mg IV Q 6 hours PRN N/V.     Intervention Category Major Interventions: Other:  [name] 02/19/2022, 2:31 AM
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
## 2022-02-19 NOTE — Progress Notes (Signed)
eLink Physician-Brief Progress Note Patient Name: Jimmy Allen DOB: Aug 19, 1976 MRN: [id number]   Date of Service  02/19/2022  HPI/Events of Note  Multiple issues: 1. Hypertension - BP = 179/91 and HR = 57. 2. Periods of apnea while asleep. Sleep Apnea? Residual opiods?  eICU Interventions  Plan: Catapres 0.1 mg PO now and TID. Narcan 0.4 mg IV PRN     Intervention Category Major Interventions: Other:  Lenell Antu 02/19/2022, 9:11 PM

## 2022-02-19 NOTE — Progress Notes (Signed)
eLink Physician-Brief Progress Note Patient Name: Jimmy Allen DOB: 25-Feb-1976 MRN: [id number]   Date of Service  02/19/2022  HPI/Events of Note  Hypocalcemia - Ionized Ca++ = 1.09. Hypokalemia - K+ = 3.3 at 6:53 PM and 4.1 at 9:50 PM from ABG.  eICU Interventions  Plan: Replace Ca++. Await K+ from 5 AM BMP.     Intervention Category Major Interventions: Electrolyte abnormality - evaluation and management  Veatrice Eckstein Eugene 02/19/2022, 1:40 AM

## 2022-02-19 NOTE — Progress Notes (Signed)
eLink Physician-Brief Progress Note Patient Name: Jimmy Allen DOB: 11/11/1975 MRN: [id number]   Date of Service  02/19/2022  HPI/Events of Note  Lactic Acid = 7.7 --> 6.0.  eICU Interventions  Plan: 0.9 NaCl 1 liter IV over 1 hour now. Repeat Lactic Acid level at 9 AM. Repeat Mg++ level at 9 AM.     Intervention Category Major Interventions: Acid-Base disturbance - evaluation and management;Electrolyte abnormality - evaluation and management  Dequarius Jeffries Eugene 02/19/2022, 5:24 AM
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
## 2022-02-19 NOTE — TOC Initial Note (Signed)
Transition of Care [facility]) - Initial/Assessment Note    Patient Details  Name: Jimmy Allen MRN: [id number] Date of Birth: [birth year]  Transition of Care [facility]) CM/SW Contact:    Angelita Ingles, RN Phone [phone]  02/19/2022, 2:12 PM  Clinical Narrative:                  Transition of Care [facility]) Screening Note   Patient Details  Name: Jimmy Allen Date of Birth: 03/13/1976   Transition of Care [facility]) CM/SW Contact:    Angelita Ingles, RN Phone Number: 02/19/2022, 2:12 PM    Transition of Care Department [facility]) has reviewed patient and no TOC needs have been identified at this time. We will continue to monitor patient advancement through interdisciplinary progression rounds. TOC will continue to follow.           Patient Goals and CMS Choice        Expected Discharge Plan and Services                                                Prior Living Arrangements/Services                       Activities of Daily Living Home Assistive Devices/Equipment: None ADL Screening (condition at time of admission) Patient's cognitive ability adequate to safely complete daily activities?: Yes Is the patient deaf or have difficulty hearing?: No Does the patient have difficulty seeing, even when wearing glasses/contacts?: No Does the patient have difficulty concentrating, remembering, or making decisions?: No Patient able to express need for assistance with ADLs?: Yes Does the patient have difficulty dressing or bathing?: No Independently performs ADLs?: Yes (appropriate for developmental age) Communication: Independent Dressing (OT): Independent Grooming: Independent Feeding: Independent Bathing: Independent Toileting: Independent In/Out Bed: Independent Walks in Home: Independent Does the patient have difficulty walking or climbing stairs?: No Weakness of Legs: None Weakness of Arms/Hands: None  Permission Sought/Granted                   Emotional Assessment              Admission diagnosis:  Overdose [T50.901A] Opioid overdose, undetermined intent, initial encounter ([place]) [T40.2X4A] Patient Active Problem List   Diagnosis Date Noted   Overdose 02/19/2022   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 [place] [address] Phone: [phone] Fax: [phone]     Social Determinants of Health (SDOH) Interventions    Readmission Risk Interventions     View : No data to display.

## 2022-02-20 DIAGNOSIS — I10 Essential (primary) hypertension: Secondary | ICD-10-CM | POA: Diagnosis present

## 2022-02-20 DIAGNOSIS — T40601A Poisoning by unspecified narcotics, accidental (unintentional), initial encounter: Secondary | ICD-10-CM

## 2022-02-20 MED ORDER — AMLODIPINE BESYLATE 10 MG PO TABS: 10.0000 mg | ORAL_TABLET | Freq: Every day | ORAL | 0 refills | Status: DC

## 2022-02-20 MED ORDER — ONDANSETRON 4 MG PO TBDP
4.0000 mg | ORAL_TABLET | Freq: Three times a day (TID) | ORAL | 0 refills | Status: DC | PRN
Start: 2022-02-20 — End: 2022-06-07

## 2022-02-20 NOTE — Discharge Summary (Signed)
Physician Discharge Summary   Patient ID: Jimmy Allen [id number] 46 y.o. 1976/05/27  Admit date: 02/18/2022  Discharge date and time: No discharge date for patient encounter.   Admitting Physician: Brand Males, MD   Discharge Physician: Lanier Clam, MD  Admission Diagnoses: Overdose [T50.901A] Opioid overdose, undetermined intent, initial encounter [facility] [T40.2X4A]  Discharge Diagnoses: Principal Problem:   Overdose of opiate or related narcotic, accidental or unintentional, initial encounter [facility]) Active Problems:   Hypertension    Admission Condition: critical  Discharged Condition: fair  Indication for Admission: Respiratory depression related to opiate overdose requiring Narcan drip  Hospital Course: Patient mid to the hospital with respiratory suppression.  Responded to Narcan.  Required Narcan drip given ongoing respiratory suppression.  Urine drug screen positive for THC and cocaine.  He endorsed unintentional overdose.  Unclear if even new opiates were present in material.  Narcan drip with subsequent weaned with adequate mental status status.  Weaned off the afternoon 5/19.  Mild AKI treated with IV fluids with good urine output, creatinine improved.  Monitored overnight in intensive care unit.  No further issues.  Noted to be hypertensive consistently.  Without associated tachycardia.  Low suspicion for withdrawal related hypertension.  Suspect primary hypertension.  Discharged on amlodipine 10 mg daily.  Instructed to follow-up with PCP.  Referral placed.  Occasional nausea treated well with Zofran this admission.  Small supply of Zofran ODT sent to pharmacy.  Consults: pulmonary/intensive care  Significant Diagnostic Studies: As per EMR  Treatments: IV hydration and Narcan  Discharge Exam: General: Well-appearing, sitting in bed Eyes: EOMI, icterus Neck: Supple, no JVP Pulmonary: Clear, normal work of breathing Cardiovascular: Warm, no  edema Abdomen: Nondistended, bowel sounds present MSK: No cellulitis no joint effusion Neuro: No weakness, sensation intact Psych: Normal mood, full affect  Disposition: Discharge disposition: 01-Home or Self Care       Patient Instructions:  Allergies as of 02/20/2022   No Known Allergies      Medication List     STOP taking these medications    methocarbamol 500 MG tablet Commonly known as: ROBAXIN   oxyCODONE 5 MG immediate release tablet Commonly known as: Oxy IR/ROXICODONE       TAKE these medications    amLODipine 10 MG tablet Commonly known as: NORVASC Take 1 tablet (10 mg total) by mouth daily.   ondansetron 4 MG disintegrating tablet Commonly known as: ZOFRAN-ODT Take 1 tablet (4 mg total) by mouth every 8 (eight) hours as needed for nausea or vomiting.       Activity: activity as tolerated Diet: regular diet Wound Care: none needed  Follow-up with PCP in a few weeks.  SignedBonna Gains [place] 02/20/2022 9:04 AM

## 2022-02-20 NOTE — TOC Transition Note (Signed)
Transition of Care [facility]) - CM/SW Discharge Note   Patient Details  Name: Jimmy Allen MRN: [id number] Date of Birth: [birth year]  Transition of Care [facility]) CM/SW Contact:  Bess Kinds, RN Phone Number: [phone] 02/20/2022, 9:33 AM   Clinical Narrative:     Spoke with patient and mother at the bedside to discuss post acute transition. Patient is from [facility]. He does not have a PCP - provided information on AVS for [facility] in [facility] and encouraged follow through for primary care and blood pressure management. Discussed substance use resources and provided [facility] contact information on AVS. Patient has transportation home. No further TOC needs identified at this time.  Final next level of care: Home/Self Care Barriers to Discharge: No Barriers Identified   Patient Goals and CMS Choice        Discharge Placement                       Discharge Plan and Services                                     Social Determinants of Health (SDOH) Interventions     Readmission Risk Interventions     View : No data to display.

## 2022-02-20 NOTE — Plan of Care (Signed)
Patient discharging home. Information provided on  substance abuse, new medications. Patient verbalized discharge instructions using teach back. Iv removed and catheter intact,

## 2022-03-19 ENCOUNTER — Other Ambulatory Visit: Payer: Self-pay | Admitting: Pulmonary Disease

## 2022-06-04 ENCOUNTER — Emergency Department (HOSPITAL_COMMUNITY): Payer: Self-pay

## 2022-06-04 ENCOUNTER — Inpatient Hospital Stay (HOSPITAL_COMMUNITY)
Admission: EM | Admit: 2022-06-04 | Discharge: 2022-06-07 | DRG: 917 | Disposition: A | Discharge status: Home / Self Care | Payer: Self-pay | Attending: Internal Medicine | Admitting: Internal Medicine

## 2022-06-04 DIAGNOSIS — K7010 Alcoholic hepatitis without ascites: Secondary | ICD-10-CM | POA: Diagnosis present

## 2022-06-04 DIAGNOSIS — R739 Hyperglycemia, unspecified: Secondary | ICD-10-CM | POA: Diagnosis present

## 2022-06-04 DIAGNOSIS — G928 Other toxic encephalopathy: Secondary | ICD-10-CM | POA: Diagnosis present

## 2022-06-04 DIAGNOSIS — E872 Acidosis, unspecified: Secondary | ICD-10-CM | POA: Diagnosis present

## 2022-06-04 DIAGNOSIS — I10 Essential (primary) hypertension: Secondary | ICD-10-CM | POA: Diagnosis present

## 2022-06-04 DIAGNOSIS — F1010 Alcohol abuse, uncomplicated: Secondary | ICD-10-CM

## 2022-06-04 DIAGNOSIS — E860 Dehydration: Secondary | ICD-10-CM | POA: Diagnosis present

## 2022-06-04 DIAGNOSIS — F10131 Alcohol abuse with withdrawal delirium: Secondary | ICD-10-CM | POA: Diagnosis not present

## 2022-06-04 DIAGNOSIS — T402X1A Poisoning by other opioids, accidental (unintentional), initial encounter: Secondary | ICD-10-CM | POA: Diagnosis present

## 2022-06-04 DIAGNOSIS — G9340 Encephalopathy, unspecified: Secondary | ICD-10-CM | POA: Diagnosis present

## 2022-06-04 DIAGNOSIS — R778 Other specified abnormalities of plasma proteins: Secondary | ICD-10-CM

## 2022-06-04 DIAGNOSIS — R7989 Other specified abnormal findings of blood chemistry: Secondary | ICD-10-CM

## 2022-06-04 DIAGNOSIS — E875 Hyperkalemia: Secondary | ICD-10-CM | POA: Diagnosis not present

## 2022-06-04 DIAGNOSIS — F17200 Nicotine dependence, unspecified, uncomplicated: Secondary | ICD-10-CM | POA: Diagnosis present

## 2022-06-04 DIAGNOSIS — Z89412 Acquired absence of left great toe: Secondary | ICD-10-CM

## 2022-06-04 DIAGNOSIS — Y902 Blood alcohol level of 40-59 mg/100 ml: Secondary | ICD-10-CM | POA: Diagnosis present

## 2022-06-04 DIAGNOSIS — R000 Tachycardia, unspecified: Secondary | ICD-10-CM | POA: Diagnosis present

## 2022-06-04 DIAGNOSIS — T43651A Poisoning by methamphetamines accidental (unintentional), initial encounter: Principal | ICD-10-CM | POA: Diagnosis present

## 2022-06-04 DIAGNOSIS — F15129 Other stimulant abuse with intoxication, unspecified: Secondary | ICD-10-CM | POA: Diagnosis present

## 2022-06-04 DIAGNOSIS — T50901A Poisoning by unspecified drugs, medicaments and biological substances, accidental (unintentional), initial encounter: Secondary | ICD-10-CM

## 2022-06-04 DIAGNOSIS — T401X1A Poisoning by heroin, accidental (unintentional), initial encounter: Secondary | ICD-10-CM | POA: Diagnosis present

## 2022-06-04 DIAGNOSIS — F1910 Other psychoactive substance abuse, uncomplicated: Secondary | ICD-10-CM

## 2022-06-04 DIAGNOSIS — K760 Fatty (change of) liver, not elsewhere classified: Secondary | ICD-10-CM | POA: Diagnosis present

## 2022-06-04 DIAGNOSIS — J189 Pneumonia, unspecified organism: Secondary | ICD-10-CM

## 2022-06-04 DIAGNOSIS — R7401 Elevation of levels of liver transaminase levels: Secondary | ICD-10-CM

## 2022-06-04 DIAGNOSIS — F1110 Opioid abuse, uncomplicated: Secondary | ICD-10-CM | POA: Diagnosis present

## 2022-06-04 DIAGNOSIS — I248 Other forms of acute ischemic heart disease: Secondary | ICD-10-CM | POA: Diagnosis present

## 2022-06-04 DIAGNOSIS — R0902 Hypoxemia: Secondary | ICD-10-CM | POA: Diagnosis present

## 2022-06-04 DIAGNOSIS — N179 Acute kidney failure, unspecified: Secondary | ICD-10-CM

## 2022-06-04 LAB — CBC WITH DIFFERENTIAL/PLATELET
Abs Immature Granulocytes: 0.07 10*3/uL (ref 0.00–0.07)
Basophils Absolute: 0 10*3/uL (ref 0.0–0.1)
Basophils Relative: 0 %
Eosinophils Absolute: 0 10*3/uL (ref 0.0–0.5)
Eosinophils Relative: 0 %
HCT: 48.8 % (ref 39.0–52.0)
Hemoglobin: 15.6 g/dL (ref 13.0–17.0)
Immature Granulocytes: 1 %
Lymphocytes Relative: 11 %
Lymphs Abs: 1.4 10*3/uL (ref 0.7–4.0)
MCH: 31.5 pg (ref 26.0–34.0)
MCHC: 32 g/dL (ref 30.0–36.0)
MCV: 98.4 fL (ref 80.0–100.0)
Monocytes Absolute: 0.9 10*3/uL (ref 0.1–1.0)
Monocytes Relative: 7 %
Neutro Abs: 10.6 10*3/uL — ABNORMAL HIGH (ref 1.7–7.7)
Neutrophils Relative %: 81 %
Platelets: 218 10*3/uL (ref 150–400)
RBC: 4.96 MIL/uL (ref 4.22–5.81)
RDW: 13.6 % (ref 11.5–15.5)
WBC: 13 10*3/uL — ABNORMAL HIGH (ref 4.0–10.5)
nRBC: 0.2 % (ref 0.0–0.2)

## 2022-06-04 LAB — RAPID URINE DRUG SCREEN, HOSP PERFORMED
Amphetamines: NOT DETECTED
Barbiturates: NOT DETECTED
Benzodiazepines: NOT DETECTED
Cocaine: POSITIVE — AB
Opiates: NOT DETECTED
Tetrahydrocannabinol: POSITIVE — AB

## 2022-06-04 LAB — COMPREHENSIVE METABOLIC PANEL
ALT: 640 U/L — ABNORMAL HIGH (ref 0–44)
AST: 3156 U/L — ABNORMAL HIGH (ref 15–41)
Albumin: 3.8 g/dL (ref 3.5–5.0)
Alkaline Phosphatase: 110 U/L (ref 38–126)
Anion gap: 22 — ABNORMAL HIGH (ref 5–15)
BUN: 14 mg/dL (ref 6–20)
CO2: 18 mmol/L — ABNORMAL LOW (ref 22–32)
Calcium: 8 mg/dL — ABNORMAL LOW (ref 8.9–10.3)
Chloride: 104 mmol/L (ref 98–111)
Creatinine, Ser: 2.3 mg/dL — ABNORMAL HIGH (ref 0.61–1.24)
GFR, Estimated: 35 mL/min — ABNORMAL LOW (ref >60)
Glucose, Bld: 41 mg/dL — CL (ref 70–99)
Potassium: 4.5 mmol/L (ref 3.5–5.1)
Sodium: 144 mmol/L (ref 135–145)
Total Bilirubin: 0.6 mg/dL (ref 0.3–1.2)
Total Protein: 6.8 g/dL (ref 6.5–8.1)

## 2022-06-04 LAB — CBG MONITORING, ED
Glucose-Capillary: 126 mg/dL — ABNORMAL HIGH (ref 70–99)
Glucose-Capillary: 128 mg/dL — ABNORMAL HIGH (ref 70–99)
Glucose-Capillary: 155 mg/dL — ABNORMAL HIGH (ref 70–99)

## 2022-06-04 LAB — LIPASE, BLOOD: Lipase: 98 U/L — ABNORMAL HIGH (ref 11–51)

## 2022-06-04 LAB — HEPATIC FUNCTION PANEL
ALT: 292 U/L — ABNORMAL HIGH (ref 0–44)
AST: 2488 U/L — ABNORMAL HIGH (ref 15–41)
Albumin: 1.7 g/dL — ABNORMAL LOW (ref 3.5–5.0)
Alkaline Phosphatase: 27 U/L — ABNORMAL LOW (ref 38–126)
Bilirubin, Direct: 0.1 mg/dL (ref 0.0–0.2)
Indirect Bilirubin: 0.3 mg/dL (ref 0.3–0.9)
Total Bilirubin: 0.4 mg/dL (ref 0.3–1.2)
Total Protein: 3.2 g/dL — ABNORMAL LOW (ref 6.5–8.1)

## 2022-06-04 LAB — GLUCOSE, CAPILLARY
Glucose-Capillary: 146 mg/dL — ABNORMAL HIGH (ref 70–99)
Glucose-Capillary: 160 mg/dL — ABNORMAL HIGH (ref 70–99)

## 2022-06-04 LAB — ETHANOL: Alcohol, Ethyl (B): 41 mg/dL — ABNORMAL HIGH (ref <10)

## 2022-06-04 LAB — AMYLASE: Amylase: 113 U/L — ABNORMAL HIGH (ref 28–100)

## 2022-06-04 LAB — HEMOGLOBIN A1C
Hgb A1c MFr Bld: 5.8 % — ABNORMAL HIGH (ref 4.8–5.6)
Mean Plasma Glucose: 119.76 mg/dL

## 2022-06-04 LAB — PHOSPHORUS: Phosphorus: 3.7 mg/dL (ref 2.5–4.6)

## 2022-06-04 LAB — MAGNESIUM: Magnesium: 1.2 mg/dL — ABNORMAL LOW (ref 1.7–2.4)

## 2022-06-04 LAB — PROTIME-INR
INR: 1.6 — ABNORMAL HIGH (ref 0.8–1.2)
Prothrombin Time: 19.3 seconds — ABNORMAL HIGH (ref 11.4–15.2)

## 2022-06-04 LAB — SALICYLATE LEVEL: Salicylate Lvl: <7 mg/dL — ABNORMAL LOW (ref 7.0–30.0)

## 2022-06-04 LAB — LACTIC ACID, PLASMA: Lactic Acid, Venous: 8.4 mmol/L (ref 0.5–1.9)

## 2022-06-04 LAB — PROCALCITONIN: Procalcitonin: 7.86 ng/mL

## 2022-06-04 LAB — ACETAMINOPHEN LEVEL: Acetaminophen (Tylenol), Serum: <10 ug/mL — ABNORMAL LOW (ref 10–30)

## 2022-06-04 MED ORDER — SODIUM CHLORIDE 0.9 % IV SOLN
2.0000 g | INTRAVENOUS | Status: DC
  Administered 2022-06-05 – 2022-06-06 (×2): 2 g via INTRAVENOUS
  Filled 2022-06-04 (×2): qty 20

## 2022-06-04 MED ORDER — THIAMINE HCL 100 MG/ML IJ SOLN
100.0000 mg | Freq: Every day | INTRAMUSCULAR | Status: DC
  Administered 2022-06-04: 100 mg via INTRAVENOUS
  Filled 2022-06-04: qty 2

## 2022-06-04 MED ORDER — DEXMEDETOMIDINE BOLUS VIA INFUSION
1.0000 ug/kg | Freq: Once | INTRAVENOUS | Status: DC
  Filled 2022-06-04: qty 75

## 2022-06-04 MED ORDER — LORAZEPAM 2 MG/ML IJ SOLN
1.0000 mg | INTRAMUSCULAR | Status: DC | PRN
  Administered 2022-06-04: 2 mg via INTRAVENOUS
  Filled 2022-06-04: qty 1

## 2022-06-04 MED ORDER — PANTOPRAZOLE SODIUM 40 MG IV SOLR
40.0000 mg | Freq: Every day | INTRAVENOUS | Status: DC
  Administered 2022-06-04: 40 mg via INTRAVENOUS
  Filled 2022-06-04: qty 10

## 2022-06-04 MED ORDER — DOCUSATE SODIUM 100 MG PO CAPS: 100.0000 mg | ORAL_CAPSULE | Freq: Two times a day (BID) | ORAL | Status: DC | PRN

## 2022-06-04 MED ORDER — ONDANSETRON HCL 4 MG/2ML IJ SOLN
INTRAMUSCULAR | Status: AC
  Filled 2022-06-04: qty 2

## 2022-06-04 MED ORDER — NALOXONE HCL 0.4 MG/ML IJ SOLN: 0.4000 mg | Freq: Once | INTRAMUSCULAR | Status: AC

## 2022-06-04 MED ORDER — SODIUM CHLORIDE 0.9 % IV BOLUS
1000.0000 mL | Freq: Once | INTRAVENOUS | Status: AC
  Administered 2022-06-04: 1000 mL via INTRAVENOUS

## 2022-06-04 MED ORDER — SODIUM CHLORIDE 0.9 % IV SOLN: INTRAVENOUS | Status: DC | PRN

## 2022-06-04 MED ORDER — INSULIN ASPART 100 UNIT/ML IJ SOLN
0.0000 [IU] | INTRAMUSCULAR | Status: DC
  Administered 2022-06-04: 3 [IU] via SUBCUTANEOUS
  Administered 2022-06-04 – 2022-06-06 (×7): 2 [IU] via SUBCUTANEOUS

## 2022-06-04 MED ORDER — PANTOPRAZOLE SODIUM 40 MG IV SOLR: 40.0000 mg | INTRAVENOUS | Status: DC

## 2022-06-04 MED ORDER — SODIUM CHLORIDE 0.9 % IV BOLUS
1000.0000 mL | Freq: Once | INTRAVENOUS | Status: AC
Start: 2022-06-04 — End: 2022-06-04
  Administered 2022-06-04: 1000 mL via INTRAVENOUS

## 2022-06-04 MED ORDER — ONDANSETRON HCL 4 MG/2ML IJ SOLN
4.0000 mg | Freq: Once | INTRAMUSCULAR | Status: AC
  Administered 2022-06-04: 4 mg via INTRAVENOUS

## 2022-06-04 MED ORDER — MAGNESIUM SULFATE IN D5W 1-5 GM/100ML-% IV SOLN
1.0000 g | Freq: Once | INTRAVENOUS | Status: AC
  Administered 2022-06-04: 1 g via INTRAVENOUS
  Filled 2022-06-04: qty 100

## 2022-06-04 MED ORDER — PANTOPRAZOLE SODIUM 40 MG PO TBEC
40.0000 mg | DELAYED_RELEASE_TABLET | Freq: Every day | ORAL | Status: DC
  Administered 2022-06-05: 40 mg via ORAL
  Filled 2022-06-04 (×2): qty 1

## 2022-06-04 MED ORDER — NALOXONE HCL 4 MG/10ML IJ SOLN
0.2500 mg/h | INTRAVENOUS | Status: DC
  Administered 2022-06-04: 0.25 mg/h via INTRAVENOUS
  Filled 2022-06-04 (×2): qty 10

## 2022-06-04 MED ORDER — PANTOPRAZOLE SODIUM 40 MG PO TBEC: 40.0000 mg | DELAYED_RELEASE_TABLET | Freq: Every day | ORAL | Status: DC

## 2022-06-04 MED ORDER — HEPARIN SODIUM (PORCINE) 5000 UNIT/ML IJ SOLN
5000.0000 [IU] | Freq: Three times a day (TID) | INTRAMUSCULAR | Status: DC
  Administered 2022-06-04 – 2022-06-07 (×6): 5000 [IU] via SUBCUTANEOUS
  Filled 2022-06-04 (×6): qty 1

## 2022-06-04 MED ORDER — ORAL CARE MOUTH RINSE
15.0000 mL | OROMUCOSAL | Status: DC | PRN
Start: 2022-06-04 — End: 2022-06-07

## 2022-06-04 MED ORDER — NALOXONE HCL 0.4 MG/ML IJ SOLN
INTRAMUSCULAR | Status: AC
  Administered 2022-06-04: 0.4 mg via INTRAVENOUS
  Filled 2022-06-04: qty 1

## 2022-06-04 MED ORDER — FOLIC ACID 5 MG/ML IJ SOLN
1.0000 mg | Freq: Once | INTRAMUSCULAR | Status: AC
  Administered 2022-06-04: 1 mg via INTRAVENOUS
  Filled 2022-06-04: qty 0.2

## 2022-06-04 MED ORDER — ONDANSETRON HCL 4 MG/2ML IJ SOLN: 4.0000 mg | Freq: Once | INTRAMUSCULAR | Status: AC

## 2022-06-04 MED ORDER — SODIUM CHLORIDE 0.9 % IV SOLN
2.0000 g | Freq: Once | INTRAVENOUS | Status: AC
  Administered 2022-06-04: 2 g via INTRAVENOUS
  Filled 2022-06-04: qty 20

## 2022-06-04 MED ORDER — POLYETHYLENE GLYCOL 3350 17 G PO PACK: 17.0000 g | PACK | Freq: Every day | ORAL | Status: DC | PRN

## 2022-06-04 MED ORDER — ONDANSETRON HCL 4 MG/2ML IJ SOLN
4.0000 mg | Freq: Once | INTRAMUSCULAR | Status: AC
  Administered 2022-06-04: 4 mg via INTRAVENOUS
  Filled 2022-06-04: qty 2

## 2022-06-04 MED ORDER — LORAZEPAM 1 MG PO TABS: 1.0000 mg | ORAL_TABLET | ORAL | Status: DC | PRN

## 2022-06-04 MED ORDER — THIAMINE MONONITRATE 100 MG PO TABS
100.0000 mg | ORAL_TABLET | Freq: Every day | ORAL | Status: DC
  Administered 2022-06-05 – 2022-06-07 (×3): 100 mg via ORAL
  Filled 2022-06-04 (×3): qty 1

## 2022-06-04 MED ORDER — SODIUM CHLORIDE 0.9 % IV SOLN
500.0000 mg | Freq: Once | INTRAVENOUS | Status: AC
  Administered 2022-06-04: 500 mg via INTRAVENOUS
  Filled 2022-06-04: qty 5

## 2022-06-04 MED ORDER — NALOXONE HCL 2 MG/2ML IJ SOSY
PREFILLED_SYRINGE | INTRAMUSCULAR | Status: AC
  Administered 2022-06-04: 2 mg via INTRAVENOUS
  Filled 2022-06-04: qty 2

## 2022-06-04 MED ORDER — DEXTROSE IN LACTATED RINGERS 5 % IV SOLN: INTRAVENOUS | Status: DC

## 2022-06-04 MED ORDER — ONDANSETRON HCL 4 MG/2ML IJ SOLN
INTRAMUSCULAR | Status: AC
  Administered 2022-06-04: 4 mg via INTRAVENOUS
  Filled 2022-06-04: qty 2

## 2022-06-04 MED ORDER — CHLORHEXIDINE GLUCONATE CLOTH 2 % EX PADS
6.0000 | MEDICATED_PAD | Freq: Every day | CUTANEOUS | Status: DC
  Administered 2022-06-04 – 2022-06-05 (×2): 6 via TOPICAL

## 2022-06-04 MED ORDER — NALOXONE HCL 2 MG/2ML IJ SOSY: 2.0000 mg | PREFILLED_SYRINGE | Freq: Once | INTRAMUSCULAR | Status: AC
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
## 2022-06-04 NOTE — Progress Notes (Addendum)
eLink Physician-Brief Progress Note Patient Name: Jimmy Allen DOB: 1976/03/11 MRN: [id number]   Date of Service  06/04/2022  HPI/Events of Note  35M admitted with overdose 2/2 polysubstance abuse and alcohol impairment. Started on Narcan gtt  WBC 13 CO2 18, BUN/Cr 14/2.3 BMET glucose 41>CBG 128, 155, 146  AST 3156 ALT 640 Normal Bili  Alcohol 41, UDS + cocaine and THC  eICU Interventions  Admitted to ICU for risk for intubation and narcan gtt Protecting airway Continue IVF for dehydration Trend BMET, LFT, f/u INR    11:48 PM   LA 8.4, Procal 7.86, INR 1.6 LFTs improving Lipase and amylase mildly elevated CT A/P with diffuse basilar L>R GGO. Normal pancreas   --S/p NS 4L. Continue maintenance IVF. Trend LFTs. Continue antibiotics.   Intervention Category Evaluation Type: New Patient Evaluation  Jimmy Allen 06/04/2022, 10:24 PM

## 2022-06-04 NOTE — ED Provider Notes (Signed)
[facility] EMERGENCY DEPARTMENT Provider Note   CSN: [id number] Arrival date & time: 06/04/22  1231     History  Chief Complaint  Patient presents with   Drug Overdose    Jimmy Allen is a 46 y.o. male.  Patient as above with significant medical history as below, including left hallux amputation, polysubstance abuse who presents to the ED with complaint of OD. Pt apneic on arrival, no meds PTA. He was given narcan on arrival with improvement to his respiratory/mental status. Pt reports he recreationally was using heroin, thc, etoh earlier today; accidential overdose. Not suicidal nor does he have plan for self harm. Reports hx prior OD. He has some mild nausea after the narcan but otherwise no acute complaints.      No past medical history on file.  Past Surgical History:  Procedure Laterality Date   AMPUTATION Left 02/06/2021   Procedure: Revision AMPUTATION FOOT left and multiple toes;  Surgeon: Nadara Mustard, MD;  Location: [facility] OR;  Service: Orthopedics;  Laterality: Left;   NO PAST SURGERIES       The history is provided by the patient. No language interpreter was used.  Drug Overdose       Home Medications Prior to Admission medications   Medication Sig Start Date End Date Taking? Authorizing Provider  amLODipine (NORVASC) 10 MG tablet TAKE 1 TABLET(10 MG) BY MOUTH DAILY 03/19/22   Hunsucker, Lesia Sago, MD  ondansetron (ZOFRAN-ODT) 4 MG disintegrating tablet Take 1 tablet (4 mg total) by mouth every 8 (eight) hours as needed for nausea or vomiting. 02/20/22   Hunsucker, Lesia Sago, MD      Allergies    Patient has no known allergies.    Review of Systems   Review of Systems  Unable to perform ROS: Mental status change    Physical Exam Updated Vital Signs BP 111/82   Pulse (!) 106   Resp 10   SpO2 93%  Physical Exam Vitals and nursing note reviewed.  Constitutional:      General: He is in acute distress.     Appearance: Normal  appearance. He is well-developed. He is diaphoretic.  HENT:     Head: Normocephalic and atraumatic. No raccoon eyes, Battle's sign or right periorbital erythema.     Jaw: There is normal jaw occlusion. No trismus.     Right Ear: External ear normal.     Left Ear: External ear normal.     Mouth/Throat:     Mouth: Mucous membranes are moist.  Eyes:     General: No scleral icterus.    Conjunctiva/sclera: Conjunctivae normal.     Comments: Pupils constricted b/l  Cardiovascular:     Rate and Rhythm: Normal rate and regular rhythm.     Pulses: Normal pulses.     Heart sounds: Normal heart sounds.  Pulmonary:     Effort: Pulmonary effort is normal. Bradypnea present. No respiratory distress.     Breath sounds: Normal breath sounds.     Comments: Hypoxia  Abdominal:     General: Abdomen is flat.     Palpations: Abdomen is soft.     Tenderness: There is generalized abdominal tenderness.  Musculoskeletal:        General: Normal range of motion.     Cervical back: Normal range of motion.     Right lower leg: No edema.     Left lower leg: No edema.  Skin:    General: Skin is warm.  Capillary Refill: Capillary refill takes less than 2 seconds.  Neurological:     Mental Status: He is unresponsive.     Comments: Apneic on arrival, unresponsive to noxious stimulus   Psychiatric:        Mood and Affect: Mood normal.        Behavior: Behavior normal.     ED Results / Procedures / Treatments   Labs (all labs ordered are listed, but only abnormal results are displayed) Labs Reviewed  COMPREHENSIVE METABOLIC PANEL - Abnormal; Notable for the following components:      Result Value   CO2 18 (*)    Glucose, Bld 41 (*)    Creatinine, Ser 2.30 (*)    Calcium 8.0 (*)    AST 3,156 (*)    ALT 640 (*)    GFR, Estimated 35 (*)    Anion gap 22 (*)    All other components within normal limits  SALICYLATE LEVEL - Abnormal; Notable for the following components:   Salicylate Lvl <7.0 (*)     All other components within normal limits  ACETAMINOPHEN LEVEL - Abnormal; Notable for the following components:   Acetaminophen (Tylenol), Serum <10 (*)    All other components within normal limits  ETHANOL - Abnormal; Notable for the following components:   Alcohol, Ethyl (B) 41 (*)    All other components within normal limits  RAPID URINE DRUG SCREEN, HOSP PERFORMED - Abnormal; Notable for the following components:   Cocaine POSITIVE (*)    Tetrahydrocannabinol POSITIVE (*)    All other components within normal limits  CBC WITH DIFFERENTIAL/PLATELET - Abnormal; Notable for the following components:   WBC 13.0 (*)    Neutro Abs 10.6 (*)    All other components within normal limits  CBG MONITORING, ED - Abnormal; Notable for the following components:   Glucose-Capillary 126 (*)    All other components within normal limits  CBG MONITORING, ED - Abnormal; Notable for the following components:   Glucose-Capillary 128 (*)    All other components within normal limits    EKG EKG Interpretation  Date/Time:  Friday June 04 2022 12:42:14 EDT Ventricular Rate:  103 PR Interval:  137 QRS Duration: 93 QT Interval:  390 QTC Calculation: 511 R Axis:   94 Text Interpretation: Sinus tachycardia Borderline right axis deviation RSR' in V1 or V2, probably normal variant Nonspecific T abnrm, anterolateral leads ST elev, probable normal early repol pattern Prolonged QT interval Confirmed by Tanda Rockers ([id number]) on 06/04/2022 4:38:05 PM  Radiology CT Head Wo Contrast  Result Date: 06/04/2022 CLINICAL DATA:  Head trauma, abnormal mental status. EXAM: CT HEAD WITHOUT CONTRAST TECHNIQUE: Contiguous axial images were obtained from the base of the skull through the vertex without intravenous contrast. RADIATION DOSE REDUCTION: This exam was performed according to the departmental dose-optimization program which includes automated exposure control, adjustment of the mA and/or kV according to patient  size and/or use of iterative reconstruction technique. COMPARISON:  Head CT 02/19/2022 FINDINGS: Brain: There is no evidence of an acute infarct, intracranial hemorrhage, mass, midline shift, or extra-axial fluid collection. The ventricles and sulci are normal. Vascular: No hyperdense vessel. Skull: No fracture or suspicious osseous lesion. Sinuses/Orbits: Mild mucosal thickening in the frontal and ethmoid sinuses. Clear mastoid air cells. Unremarkable orbits. Other: None. IMPRESSION: Unremarkable CT appearance of the brain. Electronically Signed   By: Sebastian Ache M.D.   On: 06/04/2022 13:47   DG Chest Portable 1 View  Result Date: 06/04/2022 CLINICAL  DATA:  Shortness of breath. EXAM: PORTABLE CHEST 1 VIEW COMPARISON:  Feb 18, 2022. FINDINGS: The heart size and mediastinal contours are within normal limits. Mild left basilar atelectasis or infiltrate is noted. Right perihilar nodular opacity is noted which may represent inflammation but nodule cannot be excluded. The visualized skeletal structures are unremarkable. IMPRESSION: Mild left basilar atelectasis or infiltrate. Right perihilar nodular opacity is noted which may represent inflammation or possibly nodule. CT scan of the chest is recommended for further evaluation. Electronically Signed   By: Lupita Raider M.D.   On: 06/04/2022 13:08    Procedures .Critical Care  Performed by: Sloan Leiter, DO Authorized by: Sloan Leiter, DO   Critical care provider statement:    Critical care time (minutes):  52   Critical care time was exclusive of:  Separately billable procedures and treating other patients   Critical care was necessary to treat or prevent imminent or life-threatening deterioration of the following conditions:  Toxidrome   Critical care was time spent personally by me on the following activities:  Development of treatment plan with patient or surrogate, discussions with consultants, evaluation of patient's response to treatment,  examination of patient, ordering and review of laboratory studies, ordering and review of radiographic studies, ordering and performing treatments and interventions, pulse oximetry, re-evaluation of patient's condition, review of old charts and obtaining history from patient or surrogate     Medications Ordered in ED Medications  cefTRIAXone (ROCEPHIN) 2 g in sodium chloride 0.9 % 100 mL IVPB (2 g Intravenous New Bag/Given 06/04/22 1612)  azithromycin (ZITHROMAX) 500 mg in sodium chloride 0.9 % 250 mL IVPB (500 mg Intravenous New Bag/Given 06/04/22 1608)  LORazepam (ATIVAN) tablet 1-4 mg (has no administration in time range)    Or  LORazepam (ATIVAN) injection 1-4 mg (has no administration in time range)  thiamine (VITAMIN B1) tablet 100 mg (has no administration in time range)    Or  thiamine (VITAMIN B1) injection 100 mg (has no administration in time range)  folic acid injection 1 mg (has no administration in time range)  magnesium sulfate IVPB 1 g 100 mL (has no administration in time range)  sodium chloride 0.9 % bolus 1,000 mL (0 mLs Intravenous Stopped 06/04/22 1523)  ondansetron (ZOFRAN) injection 4 mg (4 mg Intravenous Given 06/04/22 1305)  naloxone (NARCAN) injection 0.4 mg (0.4 mg Intravenous Given 06/04/22 1301)  naloxone (NARCAN) injection 0.4 mg (0.4 mg Intravenous Given 06/04/22 1300)  ondansetron (ZOFRAN) injection 4 mg (4 mg Intravenous Given 06/04/22 1259)  sodium chloride 0.9 % bolus 1,000 mL (1,000 mLs Intravenous New Bag/Given 06/04/22 1606)    ED Course/ Medical Decision Making/ A&P Clinical Course as of 06/04/22 1641  Fri Jun 04, 2022  1439 Alcohol, Ethyl (B)(!): 41 Pt reports ETOH use today PTA, also reports heroin and THC use. ?crack. UDS does show cocaine/thc [SG]    Clinical Course User Index [SG] Sloan Leiter, DO                           Medical Decision Making Amount and/or Complexity of Data Reviewed Labs: ordered. Decision-making details documented in ED  Course. Radiology: ordered.  Risk Prescription drug management.   This patient presents to the ED with chief complaint(s) of OD with pertinent past medical history of polysubstance abuse which further complicates the presenting complaint. The complaint involves an extensive differential diagnosis and also carries with it a high risk  of complications and morbidity.    Differential diagnoses for altered mental status includes but is not exclusive to alcohol, illicit or prescription medications, intracranial pathology such as stroke, intracerebral hemorrhage, fever or infectious causes including sepsis, hypoxemia, uremia, trauma, endocrine related disorders such as diabetes, hypoglycemia, thyroid-related diseases, etc.  . Serious etiologies were considered.   The initial plan is to give narcan, zofran, IVF, will also collect screening labs. Unsure if pt fell when he suffered OD, will get CTH given apparent intoxication.    Additional history obtained: Additional history obtained from EMS  Records reviewed previous admission documents and prior ED visit, prior labs/imaging >> admission 5/18 2/2 opiate overdose on narcan gtt >> discharged 5/20; was started on amlodipine 10mg  2/2 htn, also with AKI on admission that resolved prior to dc  Independent labs interpretation:  The following labs were independently interpreted:  WBC is 13, cocaine/thc is +, glucose was low on CMP yet on bedside CBG it is WNL. Cr 2.3, AST 3100, ALT 640 (chronic etoh abuse), salicylate/apap neg  Independent visualization of imaging: - I independently visualized the following imaging with scope of interpretation limited to determining acute life threatening conditions related to emergency care: CTH, CXR , which revealed CTH neg, CXR with ? PNA. CTAP pending.   Cardiac monitoring was reviewed and interpreted by myself which shows NSR  Treatment and Reassessment: Narcan x3 with good  improvement IVF Zofran Rocephin/azithro   Consultation: - Consulted or discussed management/test interpretation w/ external professional: n/a  Consideration for admission or further workup: Admission was considered     Patient with unintentional overdose earlier today, likely synthetic opiate in conjunction with THC/ETOH/COCAINE.  Patient reports polysubstance abuse.  Again recreational use, not suicidal.  Found to have elevated liver enzymes, reports frequent, often daily alcohol abuse, denies history of withdrawal.  He has mild generalized abdominal discomfort but no specific pain.  Liver enzymes are acutely elevated, T. bili is normal.  Will obtain CT imaging of the abdomen pelvis to evaluate the liver.  Without contrast given AKI. Start CIWA. Continue IV fluids.  Recommend admission for the above: Pneumonia, AKI, transaminitis  Signed out to incoming EDP pending CT imaging.            Social Determinants of health: Counseled patient for approximately 3 minutes regarding smoking cessation; cocaine/heroin/thc use. Discussed risks of smoking and how they applied and affected their visit here today. Patient not ready to quit at this time, however will follow up with their primary doctor when they are.   CPT code: : intermediate counseling for smoking cessation   Social History   Tobacco Use   Smoking status: Every Day   Smokeless tobacco: Never  Vaping Use   Vaping Use: Never used  Substance Use Topics   Alcohol use: Yes   Drug use: Yes    Types: Marijuana, Cocaine            Final Clinical Impression(s) / ED Diagnoses Final diagnoses:  Accidental overdose, initial encounter  AKI (acute kidney injury) (HCC)  Elevated transaminase level  Alcohol abuse  Polysubstance abuse (HCC)  Community acquired pneumonia, unspecified laterality    Rx / DC Orders ED Discharge Orders     None         [id number], DO 06/04/22 1641

## 2022-06-04 NOTE — ED Notes (Signed)
Wife Jimmy Allen [phone] would like an update asap

## 2022-06-04 NOTE — ED Notes (Signed)
Patient take off non-rebreathe and put on 2liters of oxygen. Currently SPO2 @ 98%, no s/s of any distress. Patient continues to be drowsy/easy arousal, converse with staff without any issues. Will continue to monitor .

## 2022-06-04 NOTE — ED Provider Notes (Signed)
  3:17 PM Patient signed out to me by previous ED physician. Pt is a 46 yo male with pmh of polysub abuse presenting to ED via EMS apneic from recreational, unintentional overdose. Improved with narcan  UDS positive for fentanyl, cocaine, THC. Alcohol positive.  AST 3,156 ALT 640 CT abd/pelvis pending CT head stable CXR demonstrates left mild basilar atelectasis versus infiltrate. Infiltrate favored, likely aspiration. Rocephin and azithromycin ordered.   Plan: admission for pneumonia, transaminitis, and AKI.   Physical Exam  BP 99/71   Pulse 92   Resp 14   SpO2 93%   Physical Exam Vitals and nursing note reviewed.  Constitutional:      General: He is in acute distress.     Appearance: He is diaphoretic.  Cardiovascular:     Rate and Rhythm: Regular rhythm. Tachycardia present.  Pulmonary:     Effort: Bradypnea present.     Comments: Apneic  Neurological:     Mental Status: He is alert.     GCS: GCS eye subscore is 1. GCS verbal subscore is 1. GCS motor subscore is 1.     Procedures  .Critical Care  Performed by: Franne Forts, DO Authorized by: Franne Forts, DO   Critical care provider statement:    Critical care time (minutes):  30   Critical care was necessary to treat or prevent imminent or life-threatening deterioration of the following conditions:  Respiratory failure (overdose)   Critical care was time spent personally by me on the following activities:  Development of treatment plan with patient or surrogate, discussions with consultants, evaluation of patient's response to treatment, examination of patient, ordering and review of laboratory studies, ordering and review of radiographic studies, ordering and performing treatments and interventions, pulse oximetry, re-evaluation of patient's condition and review of old charts   Care discussed with: admitting provider     ED Course / MDM   Clinical Course as of 06/04/22 1517  Fri Jun 04, 2022  1439 Alcohol,  Ethyl (B)(!): 41 Pt reports ETOH use today PTA, also reports heroin and THC use. ?crack. UDS does show cocaine/thc [SG]    Clinical Course User Index [SG] Sloan Leiter, DO   Medical Decision Making Amount and/or Complexity of Data Reviewed Labs: ordered. Decision-making details documented in ED Course. Radiology: ordered.  Risk OTC drugs. Prescription drug management. Decision regarding hospitalization.   6:23 PM Patient apneic and hypoxic at 60%. Non re-breather placed. Unresponsive to sternal rub. Narcan 2 mg iv given. Pt awake and breathing again. Narcan ggt ordered. I spoke with icu team who recommends Precedex for withdrawals and 2 additional iv fluid bolus. Will accept.        Franne Forts, DO 06/04/22 1955

## 2022-06-04 NOTE — ED Notes (Signed)
Patient resting without any distress. No verbal c/o pain or discomfort thus far. VSS. Will continue to monitor

## 2022-06-04 NOTE — ED Notes (Signed)
Pt became unresponsive after 2mg  ativan administration. EDP called to bedside. 2mg  Narcan administered, pt came around afterwards. Pt currently on NRB as the O2 dropped down to 75%.

## 2022-06-04 NOTE — ED Notes (Signed)
Critical Care Doctor in to assess patient

## 2022-06-04 NOTE — ED Notes (Signed)
Pt cleaned and changed at this time.

## 2022-06-05 ENCOUNTER — Inpatient Hospital Stay (HOSPITAL_COMMUNITY): Payer: Self-pay

## 2022-06-05 DIAGNOSIS — I428 Other cardiomyopathies: Secondary | ICD-10-CM

## 2022-06-05 DIAGNOSIS — J189 Pneumonia, unspecified organism: Secondary | ICD-10-CM

## 2022-06-05 DIAGNOSIS — R7401 Elevation of levels of liver transaminase levels: Secondary | ICD-10-CM

## 2022-06-05 DIAGNOSIS — F1010 Alcohol abuse, uncomplicated: Secondary | ICD-10-CM

## 2022-06-05 DIAGNOSIS — J690 Pneumonitis due to inhalation of food and vomit: Secondary | ICD-10-CM

## 2022-06-05 DIAGNOSIS — N179 Acute kidney failure, unspecified: Secondary | ICD-10-CM

## 2022-06-05 LAB — PHOSPHORUS: Phosphorus: 4.2 mg/dL (ref 2.5–4.6)

## 2022-06-05 LAB — COMPREHENSIVE METABOLIC PANEL
ALT: 449 U/L — ABNORMAL HIGH (ref 0–44)
AST: 3872 U/L — ABNORMAL HIGH (ref 15–41)
Albumin: 2.6 g/dL — ABNORMAL LOW (ref 3.5–5.0)
Alkaline Phosphatase: 41 U/L (ref 38–126)
Anion gap: 7 (ref 5–15)
BUN: 35 mg/dL — ABNORMAL HIGH (ref 6–20)
CO2: 16 mmol/L — ABNORMAL LOW (ref 22–32)
Calcium: 6 mg/dL — CL (ref 8.9–10.3)
Chloride: 114 mmol/L — ABNORMAL HIGH (ref 98–111)
Creatinine, Ser: 2.8 mg/dL — ABNORMAL HIGH (ref 0.61–1.24)
GFR, Estimated: 28 mL/min — ABNORMAL LOW (ref >60)
Glucose, Bld: 154 mg/dL — ABNORMAL HIGH (ref 70–99)
Potassium: 6.5 mmol/L (ref 3.5–5.1)
Sodium: 137 mmol/L (ref 135–145)
Total Bilirubin: 0.3 mg/dL (ref 0.3–1.2)
Total Protein: 4.9 g/dL — ABNORMAL LOW (ref 6.5–8.1)

## 2022-06-05 LAB — ECHOCARDIOGRAM COMPLETE
Area-P 1/2: 3.42 cm2
Height: 69 in
S' Lateral: 2.4 cm
Weight: 2730.18 oz

## 2022-06-05 LAB — GLUCOSE, CAPILLARY
Glucose-Capillary: 111 mg/dL — ABNORMAL HIGH (ref 70–99)
Glucose-Capillary: 123 mg/dL — ABNORMAL HIGH (ref 70–99)
Glucose-Capillary: 129 mg/dL — ABNORMAL HIGH (ref 70–99)
Glucose-Capillary: 135 mg/dL — ABNORMAL HIGH (ref 70–99)
Glucose-Capillary: 146 mg/dL — ABNORMAL HIGH (ref 70–99)
Glucose-Capillary: 84 mg/dL (ref 70–99)

## 2022-06-05 LAB — BASIC METABOLIC PANEL
Anion gap: 14 (ref 5–15)
BUN: 32 mg/dL — ABNORMAL HIGH (ref 6–20)
CO2: 23 mmol/L (ref 22–32)
Calcium: 7.4 mg/dL — ABNORMAL LOW (ref 8.9–10.3)
Chloride: 106 mmol/L (ref 98–111)
Creatinine, Ser: 2.41 mg/dL — ABNORMAL HIGH (ref 0.61–1.24)
GFR, Estimated: 33 mL/min — ABNORMAL LOW (ref >60)
Glucose, Bld: 85 mg/dL (ref 70–99)
Potassium: 4.4 mmol/L (ref 3.5–5.1)
Sodium: 143 mmol/L (ref 135–145)

## 2022-06-05 LAB — TROPONIN I (HIGH SENSITIVITY)
Troponin I (High Sensitivity): 890 ng/L (ref <18)
Troponin I (High Sensitivity): 580 ng/L (ref <18)

## 2022-06-05 LAB — POTASSIUM
Potassium: 4.4 mmol/L (ref 3.5–5.1)
Potassium: 4.4 mmol/L (ref 3.5–5.1)

## 2022-06-05 LAB — HEPATITIS PANEL, ACUTE
HCV Ab: NONREACTIVE
Hep A IgM: NONREACTIVE
Hep B C IgM: NONREACTIVE
Hepatitis B Surface Ag: NONREACTIVE

## 2022-06-05 LAB — PROTIME-INR
INR: 1.3 — ABNORMAL HIGH (ref 0.8–1.2)
Prothrombin Time: 16 seconds — ABNORMAL HIGH (ref 11.4–15.2)

## 2022-06-05 LAB — CBC
HCT: 38.2 % — ABNORMAL LOW (ref 39.0–52.0)
Hemoglobin: 12.7 g/dL — ABNORMAL LOW (ref 13.0–17.0)
MCH: 31.5 pg (ref 26.0–34.0)
MCHC: 33.2 g/dL (ref 30.0–36.0)
MCV: 94.8 fL (ref 80.0–100.0)
Platelets: 143 10*3/uL — ABNORMAL LOW (ref 150–400)
RBC: 4.03 MIL/uL — ABNORMAL LOW (ref 4.22–5.81)
RDW: 13.3 % (ref 11.5–15.5)
WBC: 5.7 10*3/uL (ref 4.0–10.5)
nRBC: 0 % (ref 0.0–0.2)

## 2022-06-05 LAB — PROCALCITONIN: Procalcitonin: 13.23 ng/mL

## 2022-06-05 LAB — LACTIC ACID, PLASMA: Lactic Acid, Venous: 2.9 mmol/L (ref 0.5–1.9)

## 2022-06-05 LAB — MAGNESIUM: Magnesium: 1.6 mg/dL — ABNORMAL LOW (ref 1.7–2.4)

## 2022-06-05 MED ORDER — DEXTROSE 50 % IV SOLN
1.0000 | Freq: Once | INTRAVENOUS | Status: AC
  Administered 2022-06-05: 50 mL via INTRAVENOUS
  Filled 2022-06-05 (×2): qty 50

## 2022-06-05 MED ORDER — ALBUTEROL SULFATE (2.5 MG/3ML) 0.083% IN NEBU
10.0000 mg | INHALATION_SOLUTION | Freq: Once | RESPIRATORY_TRACT | Status: AC
  Administered 2022-06-05: 10 mg via RESPIRATORY_TRACT
  Filled 2022-06-05: qty 12

## 2022-06-05 MED ORDER — MAGNESIUM SULFATE 2 GM/50ML IV SOLN
2.0000 g | Freq: Once | INTRAVENOUS | Status: AC
Start: 2022-06-05 — End: 2022-06-05
  Administered 2022-06-05: 2 g via INTRAVENOUS
  Filled 2022-06-05: qty 50

## 2022-06-05 MED ORDER — LORAZEPAM 1 MG PO TABS: 1.0000 mg | ORAL_TABLET | ORAL | Status: DC | PRN

## 2022-06-05 MED ORDER — LORAZEPAM 2 MG/ML IJ SOLN: 1.0000 mg | INTRAMUSCULAR | Status: DC | PRN

## 2022-06-05 MED ORDER — SODIUM BICARBONATE 8.4 % IV SOLN
50.0000 meq | Freq: Once | INTRAVENOUS | Status: AC
  Administered 2022-06-05: 50 meq via INTRAVENOUS
  Filled 2022-06-05: qty 50

## 2022-06-05 MED ORDER — CALCIUM GLUCONATE-NACL 1-0.675 GM/50ML-% IV SOLN
1.0000 g | Freq: Once | INTRAVENOUS | Status: AC
  Administered 2022-06-05: 1000 mg via INTRAVENOUS
  Filled 2022-06-05: qty 50

## 2022-06-05 MED ORDER — SODIUM ZIRCONIUM CYCLOSILICATE 10 G PO PACK
10.0000 g | PACK | Freq: Once | ORAL | Status: AC
  Administered 2022-06-05: 10 g via ORAL
  Filled 2022-06-05: qty 1

## 2022-06-05 MED ORDER — SODIUM CHLORIDE 0.9 % IV SOLN: INTRAVENOUS | Status: DC

## 2022-06-05 MED ORDER — INSULIN ASPART 100 UNIT/ML IV SOLN
5.0000 [IU] | Freq: Once | INTRAVENOUS | Status: AC
  Administered 2022-06-05: 5 [IU] via INTRAVENOUS

## 2022-06-05 MED ORDER — ADULT MULTIVITAMIN W/MINERALS CH
1.0000 | ORAL_TABLET | Freq: Every day | ORAL | Status: DC
  Administered 2022-06-06 – 2022-06-07 (×2): 1 via ORAL
  Filled 2022-06-05 (×2): qty 1

## 2022-06-05 MED ORDER — WHITE PETROLATUM EX OINT
TOPICAL_OINTMENT | CUTANEOUS | Status: DC | PRN
Start: 2022-06-05 — End: 2022-06-07
  Filled 2022-06-05: qty 28.35

## 2022-06-05 NOTE — Progress Notes (Signed)
*  PRELIMINARY RESULTS* Echocardiogram 2D Echocardiogram has been performed.  Stacey Drain 06/05/2022, 2:48 PM
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
## 2022-06-05 NOTE — Progress Notes (Addendum)
eLink Physician-Brief Progress Note Patient Name: Jimmy Allen DOB: 1976/01/02 MRN: [id number]   Date of Service  06/05/2022  HPI/Events of Note  Electrolyte abnormalities K 6.5 Verified CO2 16 BUN/Cr 35/2.8 Ca 6.0 Mg 1.6  eICU Interventions  Hyperkalemia protocol with lokelma, insulin, dextrose, albuterol and sodium bicarb Replete Ca and Mg Trend K until normalized        Jimmy Allen Mechele Collin 06/05/2022, 3:36 AM

## 2022-06-06 ENCOUNTER — Encounter (HOSPITAL_COMMUNITY): Payer: Self-pay | Admitting: Internal Medicine

## 2022-06-06 DIAGNOSIS — I10 Essential (primary) hypertension: Secondary | ICD-10-CM

## 2022-06-06 DIAGNOSIS — F1010 Alcohol abuse, uncomplicated: Secondary | ICD-10-CM

## 2022-06-06 DIAGNOSIS — R7401 Elevation of levels of liver transaminase levels: Secondary | ICD-10-CM

## 2022-06-06 DIAGNOSIS — R778 Other specified abnormalities of plasma proteins: Secondary | ICD-10-CM

## 2022-06-06 DIAGNOSIS — J189 Pneumonia, unspecified organism: Secondary | ICD-10-CM

## 2022-06-06 DIAGNOSIS — R7989 Other specified abnormal findings of blood chemistry: Secondary | ICD-10-CM

## 2022-06-06 DIAGNOSIS — F1910 Other psychoactive substance abuse, uncomplicated: Secondary | ICD-10-CM

## 2022-06-06 DIAGNOSIS — N179 Acute kidney failure, unspecified: Secondary | ICD-10-CM

## 2022-06-06 LAB — BASIC METABOLIC PANEL
Anion gap: 9 (ref 5–15)
BUN: 25 mg/dL — ABNORMAL HIGH (ref 6–20)
CO2: 21 mmol/L — ABNORMAL LOW (ref 22–32)
Calcium: 7.4 mg/dL — ABNORMAL LOW (ref 8.9–10.3)
Chloride: 108 mmol/L (ref 98–111)
Creatinine, Ser: 2.11 mg/dL — ABNORMAL HIGH (ref 0.61–1.24)
GFR, Estimated: 39 mL/min — ABNORMAL LOW (ref >60)
Glucose, Bld: 120 mg/dL — ABNORMAL HIGH (ref 70–99)
Potassium: 4.4 mmol/L (ref 3.5–5.1)
Sodium: 138 mmol/L (ref 135–145)

## 2022-06-06 LAB — HEPATIC FUNCTION PANEL
ALT: 300 U/L — ABNORMAL HIGH (ref 0–44)
AST: 856 U/L — ABNORMAL HIGH (ref 15–41)
Albumin: 2.6 g/dL — ABNORMAL LOW (ref 3.5–5.0)
Alkaline Phosphatase: 40 U/L (ref 38–126)
Bilirubin, Direct: 0.2 mg/dL (ref 0.0–0.2)
Indirect Bilirubin: 0.3 mg/dL (ref 0.3–0.9)
Total Bilirubin: 0.5 mg/dL (ref 0.3–1.2)
Total Protein: 4.9 g/dL — ABNORMAL LOW (ref 6.5–8.1)

## 2022-06-06 LAB — CBC
HCT: 32 % — ABNORMAL LOW (ref 39.0–52.0)
Hemoglobin: 11.1 g/dL — ABNORMAL LOW (ref 13.0–17.0)
MCH: 31.8 pg (ref 26.0–34.0)
MCHC: 34.7 g/dL (ref 30.0–36.0)
MCV: 91.7 fL (ref 80.0–100.0)
Platelets: 122 10*3/uL — ABNORMAL LOW (ref 150–400)
RBC: 3.49 MIL/uL — ABNORMAL LOW (ref 4.22–5.81)
RDW: 13.6 % (ref 11.5–15.5)
WBC: 10.8 10*3/uL — ABNORMAL HIGH (ref 4.0–10.5)
nRBC: 0 % (ref 0.0–0.2)

## 2022-06-06 LAB — GLUCOSE, CAPILLARY
Glucose-Capillary: 109 mg/dL — ABNORMAL HIGH (ref 70–99)
Glucose-Capillary: 123 mg/dL — ABNORMAL HIGH (ref 70–99)
Glucose-Capillary: 123 mg/dL — ABNORMAL HIGH (ref 70–99)
Glucose-Capillary: 132 mg/dL — ABNORMAL HIGH (ref 70–99)
Glucose-Capillary: 81 mg/dL (ref 70–99)

## 2022-06-06 LAB — MAGNESIUM: Magnesium: 2.1 mg/dL (ref 1.7–2.4)

## 2022-06-06 MED ORDER — PANTOPRAZOLE SODIUM 40 MG PO TBEC
40.0000 mg | DELAYED_RELEASE_TABLET | Freq: Every day | ORAL | Status: DC
  Administered 2022-06-06 – 2022-06-07 (×2): 40 mg via ORAL
  Filled 2022-06-06 (×2): qty 1

## 2022-06-06 MED ORDER — PROCHLORPERAZINE EDISYLATE 10 MG/2ML IJ SOLN
10.0000 mg | Freq: Four times a day (QID) | INTRAMUSCULAR | Status: DC | PRN
Start: 2022-06-06 — End: 2022-06-07

## 2022-06-06 MED ORDER — TRAMADOL HCL 50 MG PO TABS
50.0000 mg | ORAL_TABLET | Freq: Two times a day (BID) | ORAL | Status: DC | PRN
  Administered 2022-06-06: 50 mg via ORAL
  Filled 2022-06-06: qty 1

## 2022-06-06 MED ORDER — NICOTINE 14 MG/24HR TD PT24
14.0000 mg | MEDICATED_PATCH | Freq: Every day | TRANSDERMAL | Status: DC
  Administered 2022-06-06 – 2022-06-07 (×2): 14 mg via TRANSDERMAL
  Filled 2022-06-06 (×2): qty 1

## 2022-06-06 NOTE — Progress Notes (Signed)
PROGRESS NOTE    Jimmy Allen  BBU:[id number] DOB: [birth year] DOA: 06/04/2022 PCP: Patient, No Pcp Per    Chief Complaint  Patient presents with   Drug Overdose    Brief Narrative:  Patient 46 year old gentleman, history of prior drug use and overdose presenting with opioid and methamphetamine intoxication requiring Narcan drip and initially admitted to the ICU.  Patient also noted to have a left lower lobe aspiration pneumonia versus pneumonitis, placed empirically on IV antibiotics, placed on pulmonary hygiene.  Patient improved clinically.  Patient also noted to have elevated troponin concerning for demand ischemia, AKI, hyperkalemia, significant transaminitis.  Patient improving clinically.  Transferred to hospitalist service.   Assessment & Plan:   Principal Problem:   Encephalopathy acute Active Problems:   AKI (acute kidney injury) (HCC)   Alcohol abuse   Community acquired pneumonia   Elevated transaminase level   Polysubstance abuse (HCC)   Hypomagnesemia   Elevated troponin   #1 acute metabolic encephalopathy secondary to polysubstance and alcohol abuse -Patient presented acute metabolic encephalopathy secondary to polysubstance and alcohol abuse on presentation requiring Narcan drip which she responded to. -UDS positive for THC/cocaine, positive alcohol level on admission. -Patient did report he was an accidental OD and denied any homicidal or suicidal ideation. -Patient noted to have similar admission in May 2023. -Patient currently on the CIWA protocol, thiamine, folic acid, multivitamin. -Noted to have undergone polysubstance abuse counseling in the ICU. -Improved clinically.  2.  Prolonged QTc/elevated troponin -Elevated troponin felt likely secondary to demand ischemia. -Patient remained normotensive, noted not to be taking any antihypertensive medications at home. -2D echo done with EF of 60 to 65%, NWMA, normal right ventricular systolic function,  mild aortic dilatation noted. -High-sensitivity troponin trended down. -Supportive care.  3.  Sinus tachycardia -Secondary to dehydration, drug overdose. -Resolved.  4.  Acute kidney injury/hyperkalemia -Urinalysis on admission not done. -CT abdomen and pelvis negative for hydronephrosis or hydroureter. -Urine output of 4.3 L over the past 24 hours. -Creatinine trending down. -IV fluids saline lock per PCCM, as patient with good oral intake. -Repeat labs in the AM.  5.  Left lower lobe opacity, CAP versus aspiration -Patient currently on IV Rocephin which we will continue and if continued improvement could transition to oral antibiotics in the next 24 hours.  6.  Significant transaminitis -Questionable etiology.  Concern may be secondary to alcohol or secondary to drug use in the setting of significant dehydration. -Acute hepatitis panel negative. -Right upper quadrant ultrasound with hepatic steatosis. -INR improved. -LFTs trending down. -Repeat labs in the AM.  7.  Hypomagnesemia -Repleted.  8.  History of hypertension -Patient noted to be on Norvasc on his home med rec however per PCCM not taking at home. -BP stable with systolics in the 120s. -Monitor and if BP becomes elevated placed back on Norvasc.   DVT prophylaxis: Heparin Code Status: Full Family Communication: Updated patient.  No family at bedside. Disposition: Home when clinically improved, improvement with transaminitis and AKI hopefully in the next 1 to 2 days.  Status is: Inpatient Remains inpatient appropriate because: Severity of illness   Consultants:  Admitted to PCCM  Procedures:  CT abdomen and pelvis 06/04/2022 CT head 06/04/2022 Chest x-ray 06/04/2022, 06/05/2022 Right upper quadrant ultrasound 06/05/2022 2D echo 06/05/2022  Antimicrobials:  IV Rocephin 06/04/2022>>>   Subjective: Patient laying in bed.  Denies any chest pain.  No shortness of breath.  Overall states he is feeling better.  Denies  any nausea  or vomiting.  Asking when he is going to be able to go home.  States overdose was an accident.  Objective: Vitals:   06/06/22 0800 06/06/22 0900 06/06/22 1000 06/06/22 1100  BP: 131/80  (!) 125/94 123/86  Pulse: 94 92 90 81  Resp: (!) 9 16 13 13   Temp: 99.7 F (37.6 C)   98.8 F (37.1 C)  TempSrc: Oral   Oral  SpO2: 91% (!) 89% 92% 91%  Weight:      Height:        Intake/Output Summary (Last 24 hours) at 06/06/2022 1543 Last data filed at 06/06/2022 1300 Gross per 24 hour  Intake 900 ml  Output 4900 ml  Net -4000 ml   Filed Weights   06/04/22 2001 06/04/22 2215 06/05/22 0208  Weight: 74.8 kg 77.4 kg 77.4 kg    Examination:  General exam: Appears calm and comfortable  Respiratory system: Clear to auscultation. Respiratory effort normal. Cardiovascular system: S1 & S2 heard, RRR. No JVD, murmurs, rubs, gallops or clicks. No pedal edema. Gastrointestinal system: Abdomen is nondistended, soft and nontender. No organomegaly or masses felt. Normal bowel sounds heard. Central nervous system: Alert and oriented. No focal neurological deficits. Extremities: Symmetric 5 x 5 power. Skin: No rashes, lesions or ulcers Psychiatry: Judgement and insight appear normal. Mood & affect appropriate.     Data Reviewed: I have personally reviewed following labs and imaging studies  CBC: Recent Labs  Lab 06/04/22 1310 06/05/22 0006 06/06/22 0000  WBC 13.0* 5.7 10.8*  NEUTROABS 10.6*  --   --   HGB 15.6 12.7* 11.1*  HCT 48.8 38.2* 32.0*  MCV 98.4 94.8 91.7  PLT 218 143* 122*    Basic Metabolic Panel: Recent Labs  Lab 06/04/22 1310 06/04/22 2143 06/05/22 0006 06/05/22 0152 06/05/22 0647 06/05/22 0940 06/05/22 1403 06/06/22 0000  NA 144  --   --  137  --   --  143 138  K 4.5  --   --  6.5* 4.4 4.4 4.4 4.4  CL 104  --   --  114*  --   --  106 108  CO2 18*  --   --  16*  --   --  23 21*  GLUCOSE 41*  --   --  154*  --   --  85 120*  BUN 14  --   --  35*  --   --   32* 25*  CREATININE 2.30*  --   --  2.80*  --   --  2.41* 2.11*  CALCIUM 8.0*  --   --  6.0*  --   --  7.4* 7.4*  MG  --  1.2* 1.6*  --   --   --   --  2.1  PHOS  --  3.7 4.2  --   --   --   --   --     GFR: Estimated Creatinine Clearance: 44.7 mL/min (A) (by C-G formula based on SCr of 2.11 mg/dL (H)).  Liver Function Tests: Recent Labs  Lab 06/04/22 1310 06/04/22 2143 06/05/22 0152 06/06/22 0000  AST 3,156* 2,488* 3,872* 856*  ALT 640* 292* 449* 300*  ALKPHOS 110 27* 41 40  BILITOT 0.6 0.4 0.3 0.5  PROT 6.8 3.2* 4.9* 4.9*  ALBUMIN 3.8 1.7* 2.6* 2.6*    CBG: Recent Labs  Lab 06/05/22 1908 06/05/22 2345 06/06/22 0337 06/06/22 0803 06/06/22 1112  GLUCAP 111* 129* 109* 123* 123*  No results found for this or any previous visit (from the past 240 hour(s)).       Radiology Studies: ECHOCARDIOGRAM COMPLETE  Result Date: 06/05/2022    ECHOCARDIOGRAM REPORT   Patient Name:   ERICA OSUNA Gavitt Date of Exam: 06/05/2022 Medical Rec #:  [id number]               Height:       69.0 in Accession #:    [id number]              Weight:       170.6 lb Date of Birth:  02/17/1976                BSA:          1.931 m Patient Age:    46 years                BP:           97/71 mmHg Patient Gender: M                       HR:           86 bpm. Exam Location:  Inpatient Procedure: 2D Echo, Cardiac Doppler and Color Doppler Indications:    Cardiomyopathy-Unspecified I42.9  History:        Patient has no prior history of Echocardiogram examinations.                 Risk Factors:Hypertension. Overdose of opiate or related                 narcotic, accidental or unintentional, initial encounter (HCC).  Sonographer:    Celesta Gentile RCS Referring Phys: [phone] MURALI RAMASWAMY IMPRESSIONS  1. Left ventricular ejection fraction, by estimation, is 60 to 65%. The left ventricle has normal function. The left ventricle has no regional Llanas motion abnormalities. Left ventricular diastolic parameters were  normal.  2. Right ventricular systolic function is normal. The right ventricular size is normal. Tricuspid regurgitation signal is inadequate for assessing PA pressure.  3. The mitral valve is normal in structure. No evidence of mitral valve regurgitation. No evidence of mitral stenosis.  4. The aortic valve is normal in structure. Aortic valve regurgitation is not visualized. No aortic stenosis is present.  5. Aortic dilatation noted. There is mild dilatation of the aortic root, measuring 41 mm.  6. The inferior vena cava is dilated in size with >50% respiratory variability, suggesting right atrial pressure of 8 mmHg. FINDINGS  Left Ventricle: Left ventricular ejection fraction, by estimation, is 60 to 65%. The left ventricle has normal function. The left ventricle has no regional Dobbs motion abnormalities. The left ventricular internal cavity size was normal in size. There is  no left ventricular hypertrophy. Left ventricular diastolic parameters were normal. Right Ventricle: The right ventricular size is normal. No increase in right ventricular Nobles thickness. Right ventricular systolic function is normal. Tricuspid regurgitation signal is inadequate for assessing PA pressure. Left Atrium: Left atrial size was normal in size. Right Atrium: Right atrial size was normal in size. Pericardium: There is no evidence of pericardial effusion. Mitral Valve: The mitral valve is normal in structure. No evidence of mitral valve regurgitation. No evidence of mitral valve stenosis. Tricuspid Valve: The tricuspid valve is normal in structure. Tricuspid valve regurgitation is not demonstrated. No evidence of tricuspid stenosis. Aortic Valve: The aortic valve is normal in structure. Aortic valve regurgitation is not visualized.  No aortic stenosis is present. Pulmonic Valve: The pulmonic valve was normal in structure. Pulmonic valve regurgitation is trivial. No evidence of pulmonic stenosis. Aorta: Aortic dilatation noted. There  is mild dilatation of the aortic root, measuring 41 mm. Venous: The inferior vena cava is dilated in size with greater than 50% respiratory variability, suggesting right atrial pressure of 8 mmHg. IAS/Shunts: No atrial level shunt detected by color flow Doppler.  LEFT VENTRICLE PLAX 2D LVIDd:         4.30 cm   Diastology LVIDs:         2.40 cm   LV e' medial:    13.60 cm/s LV PW:         0.90 cm   LV E/e' medial:  6.5 LV IVS:        0.80 cm   LV e' lateral:   16.30 cm/s LVOT diam:     2.10 cm   LV E/e' lateral: 5.4 LV SV:         72 LV SV Index:   37 LVOT Area:     3.46 cm  RIGHT VENTRICLE RV S prime:     14.80 cm/s TAPSE (M-mode): 2.4 cm LEFT ATRIUM             Index        RIGHT ATRIUM           Index LA diam:        2.80 cm 1.45 cm/m   RA Area:     18.70 cm LA Vol (A2C):   47.6 ml 24.65 ml/m  RA Volume:   57.00 ml  29.52 ml/m LA Vol (A4C):   59.6 ml 30.86 ml/m LA Biplane Vol: 52.9 ml 27.39 ml/m  AORTIC VALVE LVOT Vmax:   111.00 cm/s LVOT Vmean:  66.000 cm/s LVOT VTI:    0.208 m  AORTA Ao Root diam: 4.10 cm MITRAL VALVE MV Area (PHT): 3.42 cm    SHUNTS MV Decel Time: 222 msec    Systemic VTI:  0.21 m MV E velocity: 88.30 cm/s  Systemic Diam: 2.10 cm MV A velocity: 56.10 cm/s MV E/A ratio:  1.57 Weston Brass MD Electronically signed by Weston Brass MD Signature Date/Time: 06/05/2022/3:11:53 PM    Final    US Abdomen Limited RUQ (LIVER/GB)  Result Date: 06/05/2022 CLINICAL DATA:  Elevated liver transaminases. EXAM: ULTRASOUND ABDOMEN LIMITED RIGHT UPPER QUADRANT COMPARISON:  None Available. FINDINGS: Gallbladder: No gallstones or Hebel thickening visualized. No sonographic Murphy sign noted by sonographer. Common bile duct: Diameter: 4 mm Liver: The liver demonstrates coarse echotexture and increased echogenicity, likely reflecting diffuse steatosis. No overt cirrhotic contour abnormalities or focal lesions are identified. There is no evidence of intrahepatic biliary ductal dilatation. Several small  benign cysts are noted in both lobes the liver with the largest measuring approximately 2 cm. Portal vein is patent on color Doppler imaging with normal direction of blood flow towards the liver. Other: No visualized ascites in the right upper quadrant. IMPRESSION: Evidence hepatic steatosis.  Small benign hepatic cysts. Electronically Signed   By: Irish Lack M.D.   On: 06/05/2022 12:48   DG Chest Port 1 View  Result Date: 06/05/2022 CLINICAL DATA:  46 year old male with pain, altered mental status, shortness of breath. EXAM: PORTABLE CHEST 1 VIEW COMPARISON:  Portable chest 06/04/2022. CT Abdomen and Pelvis 06/04/2022. FINDINGS: Portable AP semi upright view at 0514 hours. Lung volumes and mediastinal contours remain normal. Increased confluence of mixed interstitial and airspace  opacity at the left lung base. Additional left upper lung involvement also more apparent. Allowing for portable technique the right lung appears clear. No pneumothorax or pleural effusion. Visualized tracheal air column is within normal limits. No acute osseous abnormality identified. Negative visible bowel gas. IMPRESSION: Increasing left lung opacity suspicious for multilobar Pneumonia ( CT Abdomen and Pelvis yesterday). Aspiration could appear similar. No pleural effusion. Electronically Signed   By: Odessa Fleming M.D.   On: 06/05/2022 05:53   CT ABDOMEN PELVIS WO CONTRAST  Result Date: 06/04/2022 CLINICAL DATA:  Abdominal pain, acute, nonlocalized EXAM: CT ABDOMEN AND PELVIS WITHOUT CONTRAST TECHNIQUE: Multidetector CT imaging of the abdomen and pelvis was performed following the standard protocol without IV contrast. RADIATION DOSE REDUCTION: This exam was performed according to the departmental dose-optimization program which includes automated exposure control, adjustment of the mA and/or kV according to patient size and/or use of iterative reconstruction technique. COMPARISON:  Chest x-ray 06/04/2022, CT abdomen pelvis  06/13/2020 FINDINGS: Lower chest: Lung bases demonstrate diffuse, left lower lobe most prominent, peribronchovascular ground-glass airspace opacity and interlobular septal Panik thickening. Patulous distal esophagus filled with fluid. Hepatobiliary: Borderline enlarged splenic parenchyma. The hepatic parenchyma is diffusely hypodense compared to the splenic parenchyma consistent with fatty infiltration. No focal liver abnormality. No gallstones, gallbladder Kulak thickening, or pericholecystic fluid. No biliary dilatation. Pancreas: No focal lesion. Normal pancreatic contour. No surrounding inflammatory changes. No main pancreatic ductal dilatation. Spleen: Normal in size without focal abnormality. Adrenals/Urinary Tract: No adrenal nodule bilaterally. Bilateral kidneys enhance symmetrically. No hydronephrosis. No hydroureter. The urinary bladder is unremarkable. Stomach/Bowel: Stomach is within normal limits. No evidence of bowel Nardone thickening or dilatation. Scattered colonic diverticulosis. Appendix appears normal. Vascular/Lymphatic: No abdominal aorta or iliac aneurysm. Mild atherosclerotic plaque of the aorta and its branches. No abdominal, pelvic, or inguinal lymphadenopathy. Reproductive: Prostate is unremarkable. Other: No intraperitoneal free fluid. No intraperitoneal free gas. No organized fluid collection. Musculoskeletal: No abdominal Castro hernia or abnormality. No suspicious lytic or blastic osseous lesions. No acute displaced fracture. Multilevel mild degenerative changes spine. IMPRESSION: 1. Diffuse basilar, left lower lobe predominant, peribronchovascular ground-glass airspace opacity and interlobular septal Bellantoni thickening. Finding could represent infection or inflammation. 2. Nonspecific patulous distal esophagus filled with fluid. 3. Hepatic steatosis. 4. Scattered colonic diverticulosis with no acute diverticulitis. 5.  Aortic Atherosclerosis (ICD10-I70.0). Electronically Signed   By: Tish Frederickson M.D.   On: 06/04/2022 18:05        Scheduled Meds:  Chlorhexidine Gluconate Cloth  6 each Topical Q0600   heparin  5,000 Units Subcutaneous Q8H   insulin aspart  0-15 Units Subcutaneous Q4H   multivitamin with minerals  1 tablet Oral Daily   nicotine  14 mg Transdermal Daily   pantoprazole  40 mg Oral Q0600   thiamine  100 mg Oral Daily   Or   thiamine  100 mg Intravenous Daily   Continuous Infusions:  cefTRIAXone (ROCEPHIN)  IV 2 g (06/06/22 1357)     LOS: 2 days    Time spent: 40 minutes    Ramiro Harvest, MD [facility]   To contact the attending provider between 7A-7P or the covering provider during after hours 7P-7A, please log into the web site [email or web address] and access using universal  password for that web site. If you do not have the password, please call the hospital operator.  06/06/2022, 3:43 PM

## 2022-06-07 DIAGNOSIS — T50901A Poisoning by unspecified drugs, medicaments and biological substances, accidental (unintentional), initial encounter: Secondary | ICD-10-CM

## 2022-06-07 LAB — CBC WITH DIFFERENTIAL/PLATELET
Abs Immature Granulocytes: 0.09 10*3/uL — ABNORMAL HIGH (ref 0.00–0.07)
Basophils Absolute: 0 10*3/uL (ref 0.0–0.1)
Basophils Relative: 0 %
Eosinophils Absolute: 0.1 10*3/uL (ref 0.0–0.5)
Eosinophils Relative: 1 %
HCT: 33.8 % — ABNORMAL LOW (ref 39.0–52.0)
Hemoglobin: 11.9 g/dL — ABNORMAL LOW (ref 13.0–17.0)
Immature Granulocytes: 1 %
Lymphocytes Relative: 13 %
Lymphs Abs: 1.5 10*3/uL (ref 0.7–4.0)
MCH: 31.7 pg (ref 26.0–34.0)
MCHC: 35.2 g/dL (ref 30.0–36.0)
MCV: 90.1 fL (ref 80.0–100.0)
Monocytes Absolute: 0.6 10*3/uL (ref 0.1–1.0)
Monocytes Relative: 5 %
Neutro Abs: 9.1 10*3/uL — ABNORMAL HIGH (ref 1.7–7.7)
Neutrophils Relative %: 80 %
Platelets: 148 10*3/uL — ABNORMAL LOW (ref 150–400)
RBC: 3.75 MIL/uL — ABNORMAL LOW (ref 4.22–5.81)
RDW: 13.3 % (ref 11.5–15.5)
WBC: 11.4 10*3/uL — ABNORMAL HIGH (ref 4.0–10.5)
nRBC: 0 % (ref 0.0–0.2)

## 2022-06-07 LAB — COMPREHENSIVE METABOLIC PANEL
ALT: 197 U/L — ABNORMAL HIGH (ref 0–44)
AST: 284 U/L — ABNORMAL HIGH (ref 15–41)
Albumin: 2.5 g/dL — ABNORMAL LOW (ref 3.5–5.0)
Alkaline Phosphatase: 56 U/L (ref 38–126)
Anion gap: 8 (ref 5–15)
BUN: 12 mg/dL (ref 6–20)
CO2: 22 mmol/L (ref 22–32)
Calcium: 8.1 mg/dL — ABNORMAL LOW (ref 8.9–10.3)
Chloride: 107 mmol/L (ref 98–111)
Creatinine, Ser: 1.36 mg/dL — ABNORMAL HIGH (ref 0.61–1.24)
GFR, Estimated: >60 mL/min (ref >60)
Glucose, Bld: 90 mg/dL (ref 70–99)
Potassium: 3.6 mmol/L (ref 3.5–5.1)
Sodium: 137 mmol/L (ref 135–145)
Total Bilirubin: 0.7 mg/dL (ref 0.3–1.2)
Total Protein: 5.7 g/dL — ABNORMAL LOW (ref 6.5–8.1)

## 2022-06-07 LAB — GLUCOSE, CAPILLARY
Glucose-Capillary: 102 mg/dL — ABNORMAL HIGH (ref 70–99)
Glucose-Capillary: 115 mg/dL — ABNORMAL HIGH (ref 70–99)
Glucose-Capillary: 139 mg/dL — ABNORMAL HIGH (ref 70–99)
Glucose-Capillary: 148 mg/dL — ABNORMAL HIGH (ref 70–99)

## 2022-06-07 LAB — MAGNESIUM: Magnesium: 2.3 mg/dL (ref 1.7–2.4)

## 2022-06-07 MED ORDER — AMLODIPINE BESYLATE 10 MG PO TABS: 10.0000 mg | ORAL_TABLET | Freq: Every day | ORAL | 1 refills | Status: DC

## 2022-06-07 MED ORDER — NICOTINE 14 MG/24HR TD PT24: 14.0000 mg | MEDICATED_PATCH | Freq: Every day | TRANSDERMAL | 0 refills | Status: AC

## 2022-06-07 MED ORDER — ADULT MULTIVITAMIN W/MINERALS CH: 1.0000 | ORAL_TABLET | Freq: Every day | ORAL | Status: AC

## 2022-06-07 MED ORDER — AMLODIPINE BESYLATE 5 MG PO TABS: 5.0000 mg | ORAL_TABLET | Freq: Every day | ORAL | 11 refills | Status: DC

## 2022-06-07 MED ORDER — VITAMIN B-1 100 MG PO TABS: 100.0000 mg | ORAL_TABLET | Freq: Every day | ORAL | Status: AC

## 2022-06-07 MED ORDER — AMLODIPINE BESYLATE 5 MG PO TABS: 5.0000 mg | ORAL_TABLET | Freq: Every day | ORAL | 1 refills | Status: DC

## 2022-06-07 MED ORDER — CEFDINIR 300 MG PO CAPS
300.0000 mg | ORAL_CAPSULE | Freq: Two times a day (BID) | ORAL | Status: DC
  Administered 2022-06-07: 300 mg via ORAL
  Filled 2022-06-07: qty 1

## 2022-06-07 MED ORDER — CEFDINIR 300 MG PO CAPS: 300.0000 mg | ORAL_CAPSULE | Freq: Two times a day (BID) | ORAL | 0 refills | Status: AC

## 2022-06-07 MED ORDER — AMLODIPINE BESYLATE 10 MG PO TABS: 10.0000 mg | ORAL_TABLET | Freq: Every day | ORAL | 1 refills | Status: AC

## 2022-06-07 MED ORDER — PANTOPRAZOLE SODIUM 40 MG PO TBEC
40.0000 mg | DELAYED_RELEASE_TABLET | Freq: Every day | ORAL | 1 refills | Status: AC
Start: 2022-06-08 — End: ?

## 2022-06-07 NOTE — Progress Notes (Signed)
Patient verbalizing frustration related to "being woken up by the staff, all night".   I provided education regarding the need for pharmacological interventions and the reason for close monitoring of vital signs. I reassured him we are only attempting to provide great patient care.   He replied "just bring me some ice and leave".  A cup of ice was provided.   Will continue to monitor.

## 2022-06-07 NOTE — Progress Notes (Signed)
Patient provided discharge education and prescriptions, along with [facility] program for obtaining prescriptions. Patient called [facility] and will be escorted outside.

## 2022-06-07 NOTE — TOC Transition Note (Signed)
Transition of Care [facility]) - CM/SW Discharge Note   Patient Details  Name: Jimmy Allen MRN: [id number] Date of Birth: [birth year]  [facility] CM/SW Contact:  Harriet Masson, RN Phone Number: 06/07/2022, 2:07 PM   Clinical Narrative:     Patient stable for discharge.  Spoke to patient at bedside and patient declines SA counseling at this time.  Patient agreeable for TOC to make him a PCP apt but due to the holiday offices are closed. Information added to AVS and patient instructed to call and schedule apt tomorrow. MATCH letter done and printed to floor. This RNCM has reached out to bedside RN to give match letter to patient. Patient instructed how to use MATCH. Patient states he uses the bus for transportation. No other TOC needs at this time.  Final next level of care: Home/Self Care Barriers to Discharge: Barriers Resolved   Patient Goals and CMS Choice Patient states their goals for this hospitalization and ongoing recovery are:: go home      Discharge Placement  home                     Discharge Plan and Services In-house Referral: PCP / [facility] Discharge Planning Services: MATCH Program, CM Consult                                 Social Determinants of Health (SDOH) Interventions     Readmission Risk Interventions    06/07/2022    2:04 PM  Readmission Risk Prevention Plan  Transportation Screening Complete  PCP or Specialist Appt within 3-5 Days Not Complete  Not Complete comments patient doesn't have a PCP and it is labor day so offices are closed. put information on AVS for patient to call tomorrow to schedule apt  HRI or Home Care Consult Complete  Social Work Consult for Recovery Care Planning/Counseling Complete  Palliative Care Screening Not Applicable  Medication Review (RN Care Manager) Referral to Pharmacy
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
## 2022-06-23 ENCOUNTER — Inpatient Hospital Stay (INDEPENDENT_AMBULATORY_CARE_PROVIDER_SITE_OTHER): Payer: Self-pay | Admitting: Primary Care

## 2023-01-10 IMAGING — CT CT HEAD W/O CM
5 of 6 series · 17 of 47 positions shown, 18 images · non-contrast
Comparison: 06/10/2020

CLINICAL DATA: Altered mental status



[Series 3: head bone · axial · 0.45mm/px · z∈[+123,+171]mm · 3 of 88 slices shown]
[im 6/88  bone]
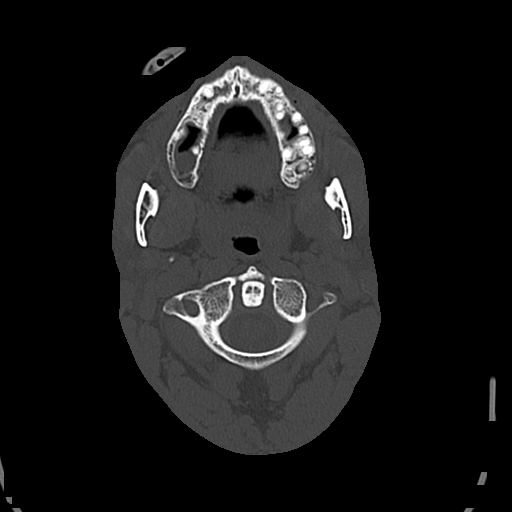
[im 18/88  bone]
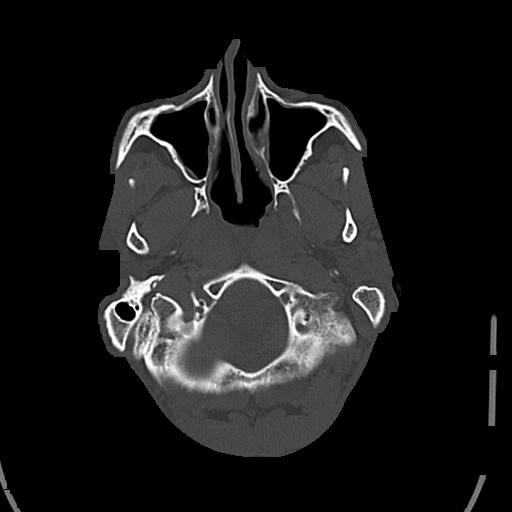
[im 30/88  bone]
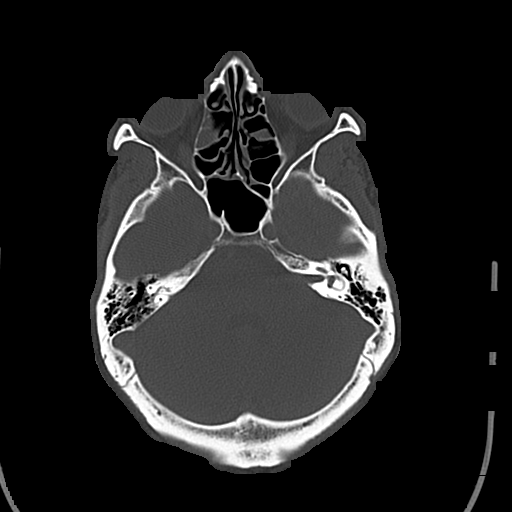

[Series 4: head without · axial · non-contrast · 0.45mm/px · z∈[+143,+248]mm · 4 of 35 slices shown, 5 images]
[im 7/35  brain]
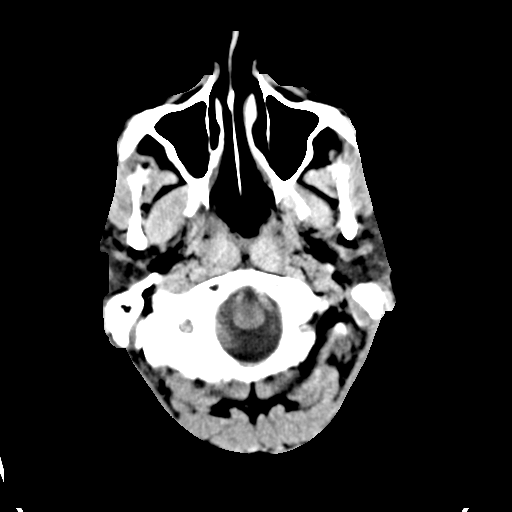
[im 7/35  bone]
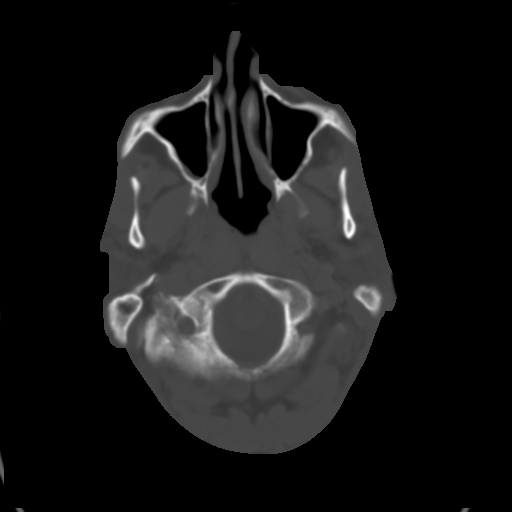
[im 14/35  brain]
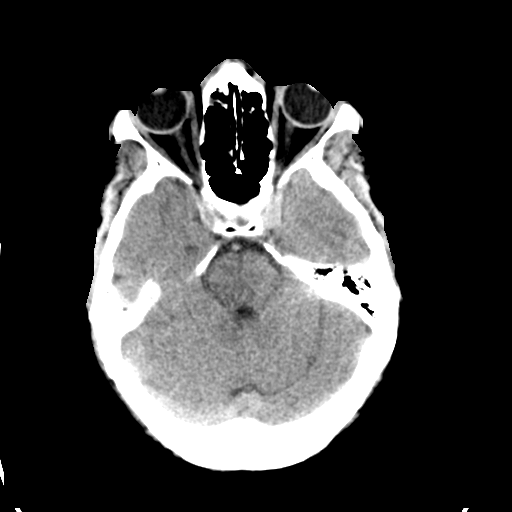
[im 21/35  brain]
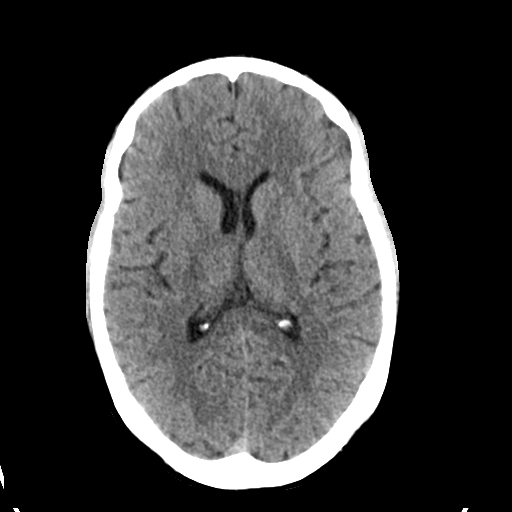
[im 28/35  brain]
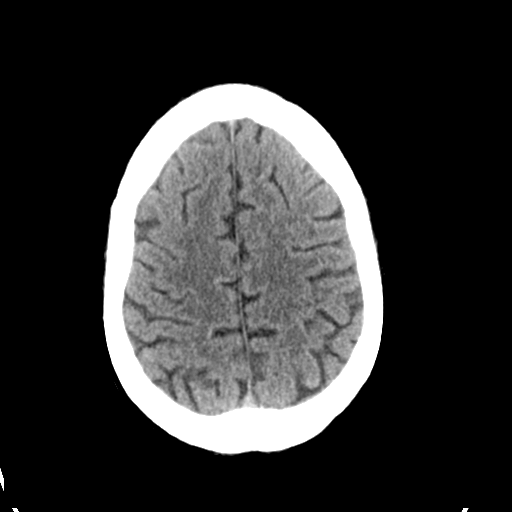

[Series 6: head without cor · coronal · non-contrast · 0.35mm/px · 3 of 76 slices shown]
[im 26/76  brain]
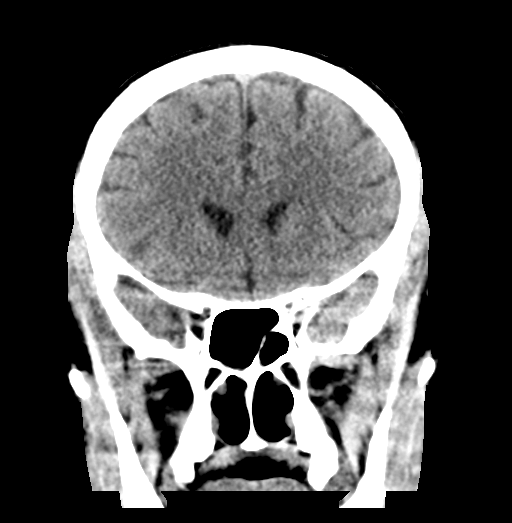
[im 34/76  brain]
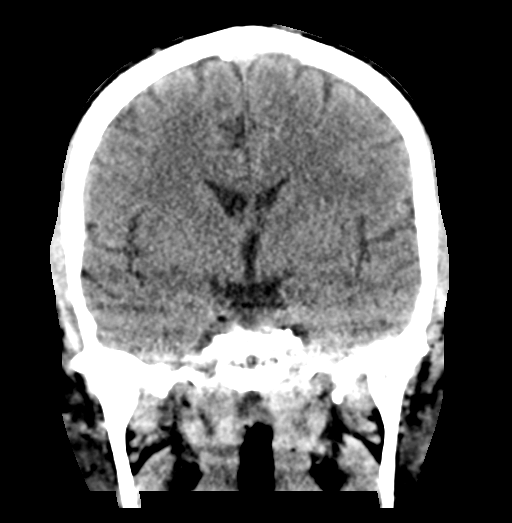
[im 42/76  brain]
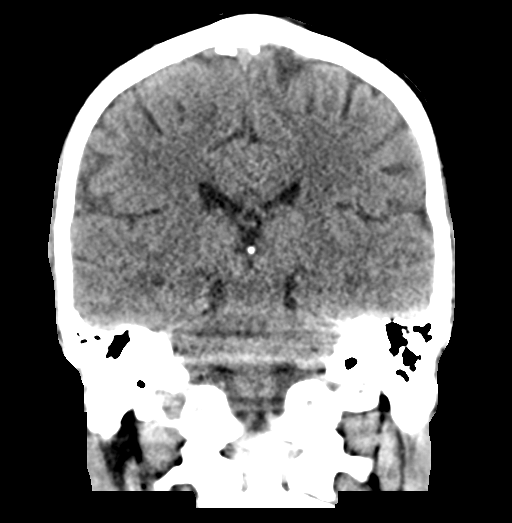

[Series 7: head without sag · sagittal · non-contrast · 0.36mm/px · 3 of 61 slices shown]
[im 21/61  brain]
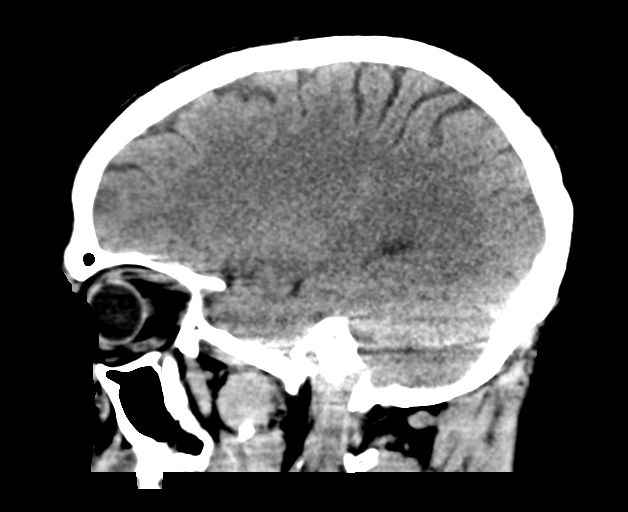
[im 31/61  brain]
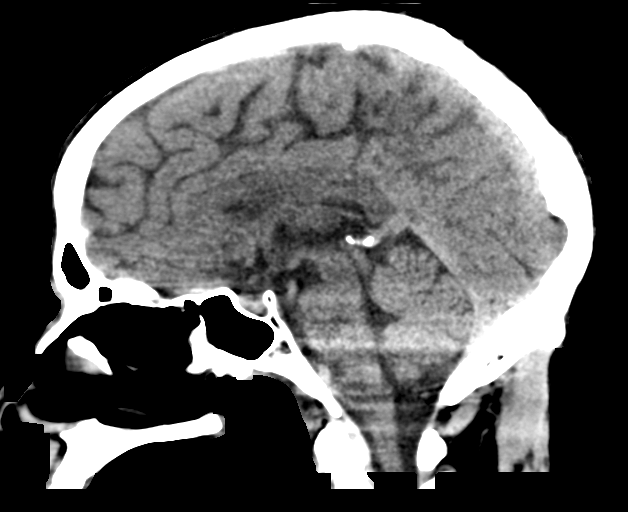
[im 41/61  brain]
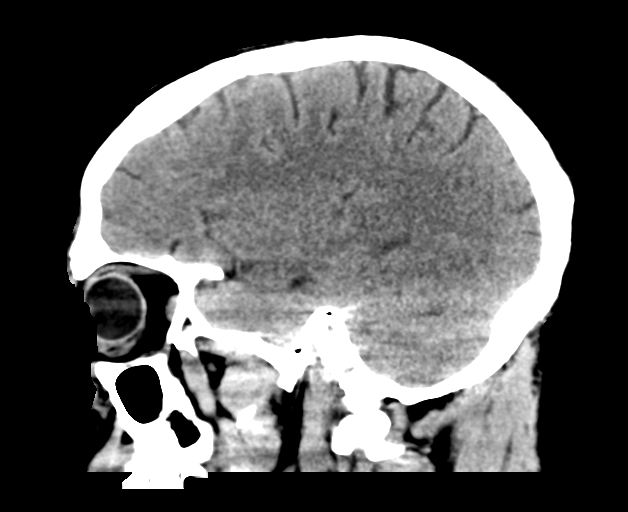

[Series 8: head without ax · axial · non-contrast · 0.35mm/px · z∈[+149,+254]mm · 4 of 36 slices shown]
[im 8/36  brain]
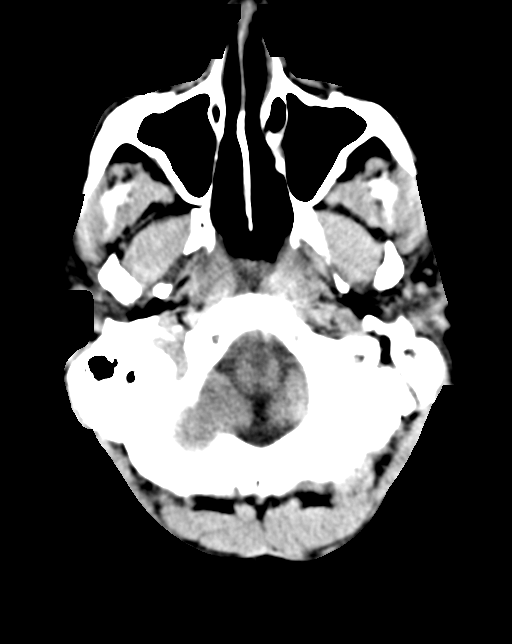
[im 15/36  brain]
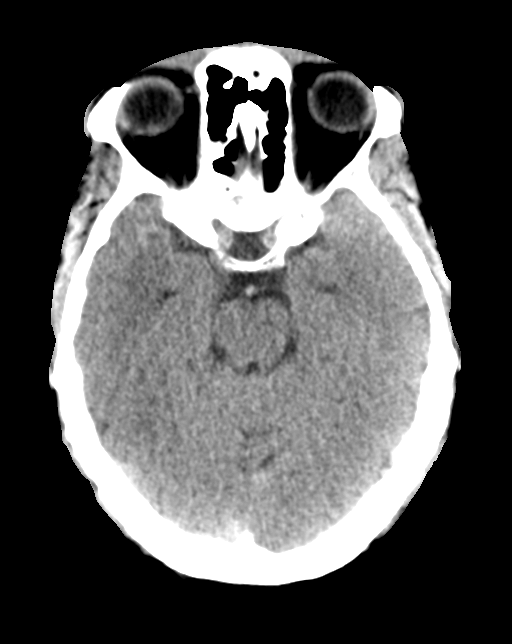
[im 22/36  brain]
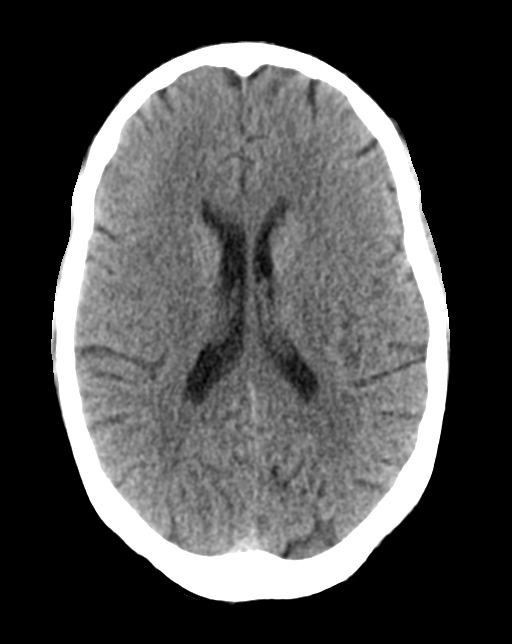
[im 29/36  brain]
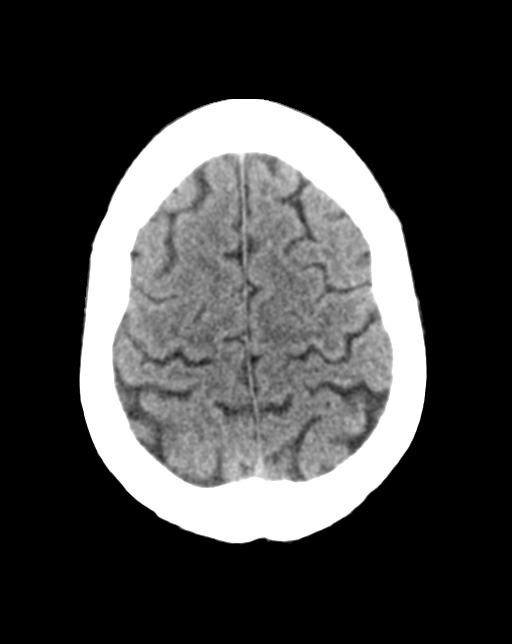

[17 of 47 positions shown; findings below may reference images not displayed]

FINDINGS: Brain: No evidence of acute infarction, hemorrhage, hydrocephalus,
extra-axial collection or mass lesion/mass effect.

Vascular: No hyperdense vessel or unexpected calcification.

Skull: Normal. Negative for fracture or focal lesion.

Sinuses/Orbits: No acute finding.

Other: None.
IMPRESSION: Normal head CT.
